# Patient Record
Sex: Female | Born: 1985 | Hispanic: No | Marital: Single | Smoking: Never smoker
Health system: Southern US, Community
[De-identification: ages and names within clinical notes are randomized; demographics above are authoritative.]

## PROBLEM LIST (undated history)

## (undated) DIAGNOSIS — E119 Type 2 diabetes mellitus without complications: Secondary | ICD-10-CM

## (undated) DIAGNOSIS — E282 Polycystic ovarian syndrome: Secondary | ICD-10-CM

## (undated) HISTORY — DX: Polycystic ovarian syndrome: E28.2

## (undated) HISTORY — PX: CHOLECYSTECTOMY: SHX55

---
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
## 2009-12-02 ENCOUNTER — Ambulatory Visit: Payer: Self-pay | Admitting: Family Medicine

## 2009-12-02 DIAGNOSIS — J45909 Unspecified asthma, uncomplicated: Secondary | ICD-10-CM | POA: Insufficient documentation

## 2009-12-02 LAB — CONVERTED CEMR LAB: Rapid Strep: NEGATIVE

## 2009-12-03 ENCOUNTER — Encounter: Payer: Self-pay | Admitting: Family Medicine

## 2010-06-29 ENCOUNTER — Ambulatory Visit: Payer: Self-pay | Admitting: Family Medicine

## 2010-07-01 ENCOUNTER — Telehealth (INDEPENDENT_AMBULATORY_CARE_PROVIDER_SITE_OTHER): Payer: Self-pay | Admitting: *Deleted

## 2010-08-06 ENCOUNTER — Ambulatory Visit: Payer: Self-pay | Admitting: Family Medicine

## 2010-08-06 DIAGNOSIS — J0100 Acute maxillary sinusitis, unspecified: Secondary | ICD-10-CM

## 2010-08-06 DIAGNOSIS — J039 Acute tonsillitis, unspecified: Secondary | ICD-10-CM

## 2010-08-06 DIAGNOSIS — R509 Fever, unspecified: Secondary | ICD-10-CM

## 2010-08-06 DIAGNOSIS — H6690 Otitis media, unspecified, unspecified ear: Secondary | ICD-10-CM | POA: Insufficient documentation

## 2010-08-06 DIAGNOSIS — N920 Excessive and frequent menstruation with regular cycle: Secondary | ICD-10-CM

## 2010-08-07 ENCOUNTER — Telehealth (INDEPENDENT_AMBULATORY_CARE_PROVIDER_SITE_OTHER): Payer: Self-pay | Admitting: *Deleted

## 2010-08-07 ENCOUNTER — Encounter: Payer: Self-pay | Admitting: Family Medicine

## 2010-08-19 ENCOUNTER — Ambulatory Visit: Payer: Self-pay | Admitting: Obstetrics & Gynecology

## 2010-08-19 ENCOUNTER — Encounter: Payer: Self-pay | Admitting: Obstetrics & Gynecology

## 2010-08-19 LAB — CONVERTED CEMR LAB
HCT: 42.1 % (ref 36.0–46.0)
Hemoglobin: 13.4 g/dL (ref 12.0–15.0)
MCV: 86.1 fL (ref 78.0–100.0)
RBC: 4.89 M/uL (ref 3.87–5.11)
RDW: 14.8 % (ref 11.5–15.5)
TSH: 1.629 microintl units/mL (ref 0.350–4.500)
Testosterone: 88.41 ng/dL — ABNORMAL HIGH (ref 10–70)
hCG, Beta Chain, Quant, S: <2 milliintl units/mL

## 2010-09-01 ENCOUNTER — Ambulatory Visit: Payer: Self-pay | Admitting: Obstetrics & Gynecology

## 2010-09-02 ENCOUNTER — Encounter
Admission: RE | Admit: 2010-09-02 | Discharge: 2010-09-02 | Payer: Self-pay | Source: Home / Self Care | Attending: Obstetrics & Gynecology | Admitting: Obstetrics & Gynecology

## 2010-09-10 ENCOUNTER — Encounter
Admission: RE | Admit: 2010-09-10 | Discharge: 2010-09-10 | Payer: Self-pay | Source: Home / Self Care | Attending: Obstetrics & Gynecology | Admitting: Obstetrics & Gynecology

## 2010-09-11 ENCOUNTER — Ambulatory Visit
Admission: RE | Admit: 2010-09-11 | Discharge: 2010-09-11 | Payer: Self-pay | Source: Home / Self Care | Attending: Family Medicine | Admitting: Family Medicine

## 2010-09-11 DIAGNOSIS — E119 Type 2 diabetes mellitus without complications: Secondary | ICD-10-CM | POA: Insufficient documentation

## 2010-09-11 LAB — CONVERTED CEMR LAB
Creatinine,U: 300 mg/dL
Hgb A1c MFr Bld: 6.3 %
Microalbumin U total vol: 30 mg/L

## 2010-09-18 ENCOUNTER — Encounter (INDEPENDENT_AMBULATORY_CARE_PROVIDER_SITE_OTHER): Payer: Self-pay | Admitting: *Deleted

## 2010-09-29 ENCOUNTER — Encounter: Payer: Self-pay | Admitting: Family Medicine

## 2010-10-04 LAB — CONVERTED CEMR LAB
Glucose, Urine, Semiquant: NEGATIVE
Nitrite: NEGATIVE
Urobilinogen, UA: 0.2
pH: 7

## 2010-10-06 NOTE — Letter (Signed)
Summary: Out of Work  [facility]  [address]   [place], [place] [address]   Phone: [phone]  Fax: [phone]    August 06, 2010   Employee:  Audrey Goodwin    To Whom It May Concern:   For Medical reasons, please excuse the above named employee from work today and tomorrow.   If you need additional information, please feel free to contact our office.         Sincerely,    Donna Christen MD

## 2010-10-06 NOTE — Assessment & Plan Note (Signed)
Summary: SORE THROAT (room 3)   Vital Signs:  Patient Profile:   25 Years Old Female CC:      sore throat and RT ear ache X 1 day Height:     64 inches Weight:      230 pounds O2 Sat:      96 % O2 treatment:    Room Air Temp:     99.1 degrees F oral Pulse rate:   102 / minute Resp:     18 per minute BP sitting:   112 / 65  (left arm) Cuff size:   large  Vitals Entered By: Lajean Saver RN (August 06, 2010 10:33 AM)                   Updated Prior Medication List: ADVIL 200 MG TABS (IBUPROFEN)  TYLENOL 325 MG TABS (ACETAMINOPHEN)  PROVENTIL HFA 108 (90 BASE) MCG/ACT AERS (ALBUTEROL SULFATE)   Current Allergies: ! * DUSTHistory of Present Illness Chief Complaint: sore throat and RT ear ache X 1 day History of Present Illness:    Subjective:  Patient complains of fatigue, severe sore throat, right earache, and right facial pain for 2 to 3 days.  She has had sweats and fatigue.  She is able to swallow fluids.  She has minimal cough.  No GI or GU symptoms.  No rash She complains of continuous menstrual period for the past 21 days.  No vaginal discharge or pelvic pain.  She is scheduled to see her GYN on Dec 14.   REVIEW OF SYSTEMS Constitutional Symptoms      Denies fever, chills, night sweats, weight loss, weight gain, and fatigue.  Eyes       Denies change in vision, eye pain, eye discharge, glasses, contact lenses, and eye surgery. Ear/Nose/Throat/Mouth       Complains of ear pain and sore throat.      Denies hearing loss/aids, change in hearing, ear discharge, dizziness, frequent runny nose, frequent nose bleeds, sinus problems, hoarseness, and tooth pain or bleeding.      Comments: rt ear Respiratory       Denies dry cough, productive cough, wheezing, shortness of breath, asthma, bronchitis, and emphysema/COPD.  Cardiovascular       Denies murmurs, chest pain, and tires easily with exhertion.    Gastrointestinal       Complains of stomach pain.      Denies  nausea/vomiting, diarrhea, constipation, blood in bowel movements, and indigestion. Genitourniary       Complains of blood or discharge from vagina.      Denies painful urination, kidney stones, and loss of urinary control.      Comments: period x 21 days OBGYN appt dec 14 Neurological       Denies paralysis, seizures, and fainting/blackouts. Musculoskeletal       Denies muscle pain, joint pain, joint stiffness, decreased range of motion, redness, swelling, muscle weakness, and gout.  Skin       Denies bruising, unusual mles/lumps or sores, and hair/skin or nail changes.  Psych       Denies mood changes, temper/anger issues, anxiety/stress, speech problems, depression, and sleep problems. Other Comments: pt c/o Sore throat and rt ear pain x 1 day. taking OTC tylenol and advil for pain. pt states she has been on her period x 21 days with abd pain. has appt with OBGYN on dec 14.   Past History:  Past Medical History: Asthma  Past Surgical History: Reviewed history  from 12/02/2009 and no changes required. Denies surgical history  Social History: Never Smoked Alcohol use-no Drug use-no Occupation:direct support Single   Objective:  No acute distress, alert and oriented  Eyes:  Pupils are equal, round, and reactive to light and accomdation.  Extraocular movement is intact.  Conjunctivae are not inflamed.  Ears:  Canals normal.  Left tympanic membrane normal.  Right tympanic membrane erythematous Nose:  Turbinates are markedly congested.  Maxillary and frontal sinus tenderness present.   Pharynx:  Erythematous and slightly swollen without obstruction, worse on right.  Minimal exudate.  Neck:  Supple.  Tender shotty anterior nodes are palpated bilaterally.  Lungs:  Clear to auscultation.  Breath sounds are equal.  Heart:  Regular rate and rhythm without murmurs, rubs, or gallops.  Abdomen:  Mild tenderness over spleen without masses or hepatosplenomegaly.  Bowel sounds are present.   No CVA or flank tenderness.  Skin:  No rash  Assessment New Problems: ACUTE MAXILLARY SINUSITIS (ICD-461.0) MENORRHAGIA (ICD-626.2) FEVER UNSPECIFIED (ICD-780.60) OTITIS MEDIA, ACUTE, RIGHT (ICD-382.9) ACUTE TONSILLITIS (ICD-463)   Patient Education:    Plan New Medications/Changes: LORTAB 5 5-500 MG TABS (HYDROCODONE-ACETAMINOPHEN) One or two tabs by mouth hs as needed pain  #10 (ten) x 0, 08/06/2010, Donna Christen MD PREDNISONE 10 MG TABS (PREDNISONE) 2 PO BID for 2 days, then 1 BID for 2 days, then 1 daily for 2 days.  Take PC  #14 x 0, 08/06/2010, Donna Christen MD CEFDINIR 300 MG CAPS (CEFDINIR) 1 by mouth q12hr  #20 x 0, 08/06/2010, Donna Christen MD  New Orders: CBC [[id number]] Urinalysis [CPT-81003] Monospot [13086] Urine Pregnancy  [CPT-81025] Rapid Strep [57846] Rocephin  250mg  [J0696] Solumedrol up to 125mg  [J2930] T-Culture, Throat [96295-28413] Admin of Therapeutic Inj  intramuscular or subcutaneous [96372] Est. Patient Level IV [24401] Planning Comments:   Rapid strep test negative; Monospot negative; Urine pregnancy test negative; CBC:  WBC 13.8 with 76.1 GR; Hgb 13.4 Rocephin 1gm IM; SoluMedrol 125mg  IM; urinalysis unremarkable.  Throat culture pending Begin Omnicef and tapering course of prednisone.  Analgesic at bedtime.  Rest.  Increase fluid intake. Follow-up with GYN as scheduled. Return for worsening symptoms   The patient and/or caregiver has been counseled thoroughly with regard to medications prescribed including dosage, schedule, interactions, rationale for use, and possible side effects and they verbalize understanding.  Diagnoses and expected course of recovery discussed and will return if not improved as expected or if the condition worsens. Patient and/or caregiver verbalized understanding.  Prescriptions: LORTAB 5 5-500 MG TABS (HYDROCODONE-ACETAMINOPHEN) One or two tabs by mouth hs as needed pain  #10 (ten) x 0   Entered and Authorized by:   Donna Christen MD   Signed by:   Donna Christen MD on 08/06/2010   Method used:   Print then Give to Patient   RxID:   [id number] PREDNISONE 10 MG TABS (PREDNISONE) 2 PO BID for 2 days, then 1 BID for 2 days, then 1 daily for 2 days.  Take PC  #14 x 0   Entered and Authorized by:   Donna Christen MD   Signed by:   Donna Christen MD on 08/06/2010   Method used:   Print then Give to Patient   RxID:   [id number] CEFDINIR 300 MG CAPS (CEFDINIR) 1 by mouth q12hr  #20 x 0   Entered and Authorized by:   Donna Christen MD   Signed by:   Donna Christen MD on 08/06/2010   Method used:   Print then Give to Patient   RxID:   [id number]   Medication Administration  Injection # 1:    Medication: Rocephin  250mg     Diagnosis: FEVER UNSPECIFIED  (ICD-780.60)    Route: IM    Site: RUOQ gluteus    Exp Date: 01/04/2013    Lot #: [id number]    Mfr: Sandoz    Comments: 1 gram given    Patient tolerated injection without complications    Given by: Lajean Saver RN (August 06, 2010 12:47 PM)  Injection # 2:    Medication: Solumedrol up to 125mg     Diagnosis: FEVER UNSPECIFIED (ICD-780.60)    Route: IM    Site: LUOQ gluteus    Exp Date: 02/04/2013    Lot #: [id number]    Mfr: Pharmacia    Patient tolerated injection without complications    Given by: Lajean Saver RN (August 06, 2010 12:48 PM)  Orders Added:  1)  CBC [[address]-10000] 2)  Urinalysis [CPT-81003] 3)  Monospot [34742] 4)  Urine Pregnancy [CPT-81025] 5)  Rapid Strep [59563] 6)  Rocephin  250mg  [J0696] 7)  Solumedrol up to 125mg  [J2930] 8)  T-Culture, Throat [87564-33295] 9)  Admin of Therapeutic Inj  intramuscular or subcutaneous [96372] 10)  Est. Patient Level IV [18841]    Laboratory Results   Urine Tests  Date/Time Received: August 06, 2010 11:31 AM  Date/Time Reported: August 06, 2010 11:31 AM   Routine Urinalysis   Color: yellow Appearance: Hazy Glucose: negative   (Normal Range: Negative) Bilirubin: negative   (Normal Range: Negative) Ketone: negative   (Normal Range: Negative) Spec. Gravity: 1.020   (Normal Range: 1.003-1.035) Blood: trace-intact   (Normal Range: Negative) pH: 7.0   (Normal Range: 5.0-8.0) Protein: negative   (Normal Range: Negative) Urobilinogen: 0.2   (Normal Range: 0-1) Nitrite: negative   (Normal Range: Negative) Leukocyte Esterace: negative   (Normal Range: Negative)    Urine HCG: negative  Blood Tests   Date/Time Received: August 06, 2010 11:30 AM  Date/Time Reported: August 06, 2010 11:30 AM    Mono: negative Date/Time Received: August 06, 2010 10:57 AM  Date/Time Reported: August 06, 2010 10:58 AM   Other Tests  Rapid Strep: negative  Kit Test Internal QC: Negative   (Normal Range:  Negative)

## 2010-10-06 NOTE — Letter (Signed)
Summary: Out of Work  [facility]  [address]   [place], [place] [address]   Phone: [phone]  Fax: [phone]    June 29, 2010   Employee:  WILLETTE MUDRY    To Whom It May Concern:   For Medical reasons, please excuse the above named employee from work today.    If you need additional information, please feel free to contact our office.         Sincerely,    Donna Christen MD

## 2010-10-06 NOTE — Progress Notes (Signed)
  Phone Note Outgoing Call Call back at [facility] Phone [phone]   Call placed by: Emilio Math,  August 07, 2010 12:14 PM Call placed to: Patient Summary of Call: Left msg

## 2010-10-06 NOTE — Progress Notes (Signed)
  Phone Note Outgoing Call Call back at [facility] Phone [phone]   Call placed by: Lajean Saver RN,  July 01, 2010 12:45 PM Call placed to: Patient Summary of Call: Patient is still coughing but is improving. Encouraged to continue with Rxs

## 2010-10-06 NOTE — Letter (Signed)
Summary: Out of Work  [facility]  [address]   [place], [place] [address]   Phone: [phone]  Fax: [phone]    December 02, 2009   Employee:  LADAVIA LINDENBAUM    To Whom It May Concern:   For Medical reasons, please excuse the above named employee from work today and tomorrow.  If you need additional information, please feel free to contact our office.         Sincerely,    Donna Christen MD

## 2010-10-06 NOTE — Assessment & Plan Note (Signed)
Summary: POSSIBLE FLU   Vital Signs:  Patient Profile:   25 Years Old Female CC:      HA, productive cough, chills, N/V, diarreha, X 10 days Height:     64 inches Weight:      233 pounds O2 Sat:      97 % O2 treatment:    Room Air Temp:     97.0 degrees F oral Pulse rate:   93 / minute Resp:     20 per minute BP sitting:   127 / 72  (right arm) Cuff size:   large  Pt. in pain?   yes    Location:   head    Intensity:   8    Type:       aching  Vitals Entered By: Lajean Saver RN (December 02, 2009 8:58 AM)                   Updated Prior Medication List: Arloa Koh D 60-600 MG XR12H-TAB (PSEUDOEPHEDRINE-GUAIFENESIN)   Current Allergies: ! * DUSTHistory of Present Illness Chief Complaint: HA, productive cough, chills, N/V, diarreha, X 10 days History of Present Illness: Subjective: Patient complains of persistent allergy symptoms for about 10 days (she has history of seasonal allergies).  About 4 days ago she developed increased congestion with mild sore throat.  She has a history of asthma and has had increased chest congestion + cough worse at night. No pleuritic pain No wheezing + post-nasal drainage No sinus pain/pressure No itchy/red eyes No earache No hemoptysis No SOB but has tightness in anterior chest. Had fever/chills about 4 days ago resolved No nausea No vomiting No abdominal pain Had diarrhea briefly several days ago resolved No skin rashes + fatigue No myalgias No headache Used OTC meds without relief   REVIEW OF SYSTEMS Constitutional Symptoms       Complains of chills and fatigue.     Denies fever, night sweats, weight loss, and weight gain.  Eyes       Denies change in vision, eye pain, eye discharge, glasses, contact lenses, and eye surgery. Ear/Nose/Throat/Mouth       Denies hearing loss/aids, change in hearing, ear pain, ear discharge, dizziness, frequent runny nose, frequent nose bleeds, sinus problems, sore throat, hoarseness, and tooth  pain or bleeding.  Respiratory       Complains of productive cough and shortness of breath.      Denies dry cough, wheezing, asthma, bronchitis, and emphysema/COPD.  Cardiovascular       Denies murmurs, chest pain, and tires easily with exhertion.    Gastrointestinal       Complains of nausea/vomiting and diarrhea.      Denies stomach pain, constipation, blood in bowel movements, and indigestion. Genitourniary       Denies painful urination, kidney stones, and loss of urinary control. Neurological       Complains of headaches.      Denies paralysis, seizures, and fainting/blackouts. Musculoskeletal       Denies muscle pain, joint pain, joint stiffness, decreased range of motion, redness, swelling, muscle weakness, and gout.  Skin       Denies bruising, unusual mles/lumps or sores, and hair/skin or nail changes.  Psych       Denies mood changes, temper/anger issues, anxiety/stress, speech problems, depression, and sleep problems. Other Comments: patient was previously takign anantibiotic for a tooth infection. symptoms X 10 days   Past History:  Past Medical History: Asthma recent tooth infection  Past  Surgical History: Denies surgical history  Family History: Leukemia- Mother  Social History: Never Smoked Alcohol use-no Drug use-no Smoking Status:  never Drug Use:  no   Objective:  No acute distress  Eyes:  Pupils are equal, round, and reactive to light and accomdation.  Extraocular movement is intact.  Conjunctivae are not inflamed.  Ears:  Canals normal.   Left tympanic membrane normal.  Right tympanic membrane has serous effusion Nose:  Congested turbinates worse on right.  No sinus tenderness Pharynx:  Erythematous Neck:  Supple.  Slightly tender shotty anterior/posterior nodes are palpated bilaterally.  Lungs:   Faint scattered wheezes.   Breath sounds are equal.    Heart:  Regular rate and rhythm without murmurs, rubs, or gallops.  Abdomen:  Nontender without  masses or hepatosplenomegaly.  Bowel sounds are present.  No CVA or flank tenderness.  Rapid strep test negative  Assessment New Problems: OTITIS MEDIA, SEROUS, ACUTE, RIGHT (ICD-381.01) URI (ICD-465.9) ASTHMA (ICD-493.90)   Plan New Medications/Changes: PROAIR HFA 108 (90 BASE) MCG/ACT AERS (ALBUTEROL SULFATE) Two inhalations q4-6hr as needed.  Max 12 puffs/day  #1 MDI x 0, 12/02/2009, Donna Christen MD PREDNISONE 10 MG TABS (PREDNISONE) 2 PO BID for 2 days, then 1 BID for 2 days, then 1 daily for 2 days.  Take PC  #14 x 0, 12/02/2009, Donna Christen MD BENZONATATE 200 MG CAPS (BENZONATATE) One by mouth hs as needed cough  #12 x 0, 12/02/2009, Donna Christen MD AZITHROMYCIN 250 MG TABS (AZITHROMYCIN) Two tabs by mouth on day 1, then 1 tab daily on days 2 through 5  #6 tabs x 0, 12/02/2009, Donna Christen MD  New Orders: New Patient Level III [99203] Rapid Strep [16606] Planning Comments:   Begin Z-pack, expectorant/decongestant, cough suppressant at bedtime, tapering course of prednisone, albuterol inhaler. Follow-up with PCP if not improving one week.   The patient and/or caregiver has been counseled thoroughly with regard to medications prescribed including dosage, schedule, interactions, rationale for use, and possible side effects and they verbalize understanding.  Diagnoses and expected course of recovery discussed and will return if not improved as expected or if the condition worsens. Patient and/or caregiver verbalized understanding.  Prescriptions: PROAIR HFA 108 (90 BASE) MCG/ACT AERS (ALBUTEROL SULFATE) Two inhalations q4-6hr as needed.  Max 12 puffs/day  #1 MDI x 0   Entered and Authorized by:   Donna Christen MD   Signed by:   Donna Christen MD on 12/02/2009   Method used:   Print then Give to Patient   RxID:   [id number] PREDNISONE 10 MG TABS (PREDNISONE) 2 PO BID for 2 days, then 1 BID for 2 days, then 1 daily for 2 days.  Take PC  #14 x 0   Entered and Authorized by:    Donna Christen MD   Signed by:   Donna Christen MD on 12/02/2009   Method used:   Print then Give to Patient   RxID:   [id number] BENZONATATE 200 MG CAPS (BENZONATATE) One by mouth hs as needed cough  #12 x 0   Entered and Authorized by:   Donna Christen MD   Signed by:   Donna Christen MD on 12/02/2009   Method used:   Print then Give to Patient   RxID:   [id number] AZITHROMYCIN 250 MG TABS (AZITHROMYCIN) Two tabs by mouth on day 1, then 1 tab daily on days 2 through 5  #6 tabs x 0   Entered and Authorized by:   Donna Christen  MD   Signed by:   Donna Christen MD on 12/02/2009   Method used:   Print then Give to Patient   RxID:   [id number]   Patient Instructions: 1)  May use Mucinex D (guaifenesin with decongestant) twice daily for congestion. 2)  Increase fluid intake, rest. 3)  May use Afrin nasal spray (or generic oxymetazoline) twice daily for about 5 days.  Also recommend using saline nasal spray several times daily and/or saline nasal irrigation. 4)  Followup with family doctor if not improving one week.   Laboratory Results  Date/Time Received: December 02, 2009 9:55 AM  Date/Time Reported: December 02, 2009 9:55 AM   Other Tests  Rapid Strep: negative  Kit Test Internal QC: Negative   (Normal Range: Negative)

## 2010-10-06 NOTE — Letter (Signed)
Summary: Handout Printed  Printed Handout:  - Rheumatic Fever 

## 2010-10-06 NOTE — Assessment & Plan Note (Signed)
Summary: Cough-greenish yellow, L ear pain, wheezing x 2 wks rm 3   Vital Signs:  Patient Profile:   25 Years Old Female CC:      Cold & URI symptoms Height:     64 inches Weight:      225 pounds O2 Sat:      98 % O2 treatment:    Room Air Temp:     98.3 degrees F oral Pulse rate:   87 / minute Pulse rhythm:   regular Resp:     16 per minute BP sitting:   106 / 73  (left arm) Cuff size:   regular  Vitals Entered By: Areta Haber CMA (June 29, 2010 4:45 PM)                  Current Allergies: ! * DUSTHistory of Present Illness Chief Complaint: Cold & URI symptoms History of Present Illness:  Subjective: Patient complains of cough for 2 weeks.  Symptoms started as a cold, now worse past 2 days. + sore throat Cough now worse at night. No pleuritic pain Occasional wheezing + nasal congestion + post-nasal drainage ? sinus pain/pressure No itchy/red eyes + left earache for one week No hemoptysis No SOB No fever/chills No nausea No vomiting No abdominal pain No diarrhea No skin rashes + fatigue No myalgias No headache Used OTC meds without relief   Current Problems: OTITIS MEDIA, LEFT (ICD-382.9) ACUTE BRONCHITIS (ICD-466.0) OTITIS MEDIA, SEROUS, ACUTE, RIGHT (ICD-381.01) URI (ICD-465.9) ASTHMA (ICD-493.90)   Current Meds MUCINEX D 60-600 MG XR12H-TAB (PSEUDOEPHEDRINE-GUAIFENESIN)  AZITHROMYCIN 250 MG TABS (AZITHROMYCIN) Two tabs by mouth on day 1, then 1 tab daily on days 2 through 5 BENZONATATE 200 MG CAPS (BENZONATATE) One by mouth hs as needed cough PREDNISONE 10 MG TABS (PREDNISONE) 2 PO BID for 2 days, then 1 BID for 2 days, then 1 daily for 2 days.  Take PC  REVIEW OF SYSTEMS Constitutional Symptoms      Denies fever, chills, night sweats, weight loss, weight gain, and fatigue.  Eyes       Denies change in vision, eye pain, eye discharge, glasses, contact lenses, and eye surgery. Ear/Nose/Throat/Mouth       Complains of ear pain and  sore throat.      Denies hearing loss/aids, change in hearing, ear discharge, dizziness, frequent runny nose, frequent nose bleeds, sinus problems, hoarseness, and tooth pain or bleeding.      Comments: L x 2wks Respiratory       Complains of productive cough, wheezing, and shortness of breath.      Denies dry cough, asthma, bronchitis, and emphysema/COPD.      Comments: Chest congestion Cardiovascular       Denies murmurs, chest pain, and tires easily with exhertion.    Gastrointestinal       Denies stomach pain, nausea/vomiting, diarrhea, constipation, blood in bowel movements, and indigestion. Genitourniary       Denies painful urination, kidney stones, and loss of urinary control. Neurological       Denies paralysis, seizures, and fainting/blackouts. Musculoskeletal       Denies muscle pain, joint pain, joint stiffness, decreased range of motion, redness, swelling, muscle weakness, and gout.  Skin       Denies bruising, unusual mles/lumps or sores, and hair/skin or nail changes.  Psych       Denies mood changes, temper/anger issues, anxiety/stress, speech problems, depression, and sleep problems. Other Comments: Greenish-yellow x 2 wks. Pt has not seen  a PCP for this.   Past History:  Past Medical History: Last updated: 12/02/2009 Asthma recent tooth infection  Past Surgical History: Last updated: 12/02/2009 Denies surgical history  Family History: Last updated: 12/02/2009 Leukemia- Mother  Social History: Last updated: 12/02/2009 Never Smoked Alcohol use-no Drug use-no  Risk Factors: Smoking Status: never (12/02/2009)   Objective:  No acute distress  Eyes:  Pupils are equal, round, and reactive to light and accomdation.  Extraocular movement is intact.  Conjunctivae are not inflamed.  Ears:  Canals normal.   Left tympanic membrane red; right tympanic membrane normal Nose:  Normal septum.  Normal turbinates, mildly congested.  Mild maxillary sinus tenderness  present.  Pharynx:  Erythematous Neck:  Supple.  No adenopathy is present.  No thyromegaly is present  Lungs:   Scattered wheezes.  Breath sounds are equal.  Heart:  Regular rate and rhythm without murmurs, rubs, or gallops.  Abdomen:  Nontender without masses or hepatosplenomegaly.  Bowel sounds are present.  No CVA or flank tenderness.  Assessment New Problems: OTITIS MEDIA, LEFT (ICD-382.9) ACUTE BRONCHITIS (ICD-466.0)  SUSPECT SINUSITIS ALSO  Plan New Medications/Changes: PREDNISONE 10 MG TABS (PREDNISONE) 2 PO BID for 2 days, then 1 BID for 2 days, then 1 daily for 2 days.  Take PC  #14 x 0, 06/29/2010, Donna Christen MD BENZONATATE 200 MG CAPS (BENZONATATE) One by mouth hs as needed cough  #12 x 0, 06/29/2010, Donna Christen MD AZITHROMYCIN 250 MG TABS (AZITHROMYCIN) Two tabs by mouth on day 1, then 1 tab daily on days 2 through 5  #6 tabs x 0, 06/29/2010, Donna Christen MD  New Orders: Est. Patient Level III [phone] Planning Comments:   Z-pack, expectorant/decongestant, cough suppressant at bedtime, tapering course of prednisone. Follow-up with PCP if not improving. Recommend flu immunization when well   The patient and/or caregiver has been counseled thoroughly with regard to medications prescribed including dosage, schedule, interactions, rationale for use, and possible side effects and they verbalize understanding.  Diagnoses and expected course of recovery discussed and will return if not improved as expected or if the condition worsens. Patient and/or caregiver verbalized understanding.  Prescriptions: PREDNISONE 10 MG TABS (PREDNISONE) 2 PO BID for 2 days, then 1 BID for 2 days, then 1 daily for 2 days.  Take PC  #14 x 0   Entered and Authorized by:   Donna Christen MD   Signed by:   Donna Christen MD on 06/29/2010   Method used:   Print then Give to Patient   RxID:   [id number] BENZONATATE 200 MG CAPS (BENZONATATE) One by mouth hs as needed cough  #12 x 0   Entered  and Authorized by:   Donna Christen MD   Signed by:   Donna Christen MD on 06/29/2010   Method used:   Print then Give to Patient   RxID:   [id number] AZITHROMYCIN 250 MG TABS (AZITHROMYCIN) Two tabs by mouth on day 1, then 1 tab daily on days 2 through 5  #6 tabs x 0   Entered and Authorized by:   Donna Christen MD   Signed by:   Donna Christen MD on 06/29/2010   Method used:   Print then Give to Patient   RxID:   [id number]   Patient Instructions: 1)  May use Mucinex D (guaifenesin with decongestant) twice daily for congestion. 2)  Increase fluid intake, rest. 3)  May use Afrin nasal spray (or generic oxymetazoline) twice daily for about 5 days.  Also recommend using saline nasal spray several times daily and/or saline nasal irrigation. 4)  Followup with family doctor if not improving one week.   Orders Added: 1)  Est. Patient Level III [13086]

## 2010-10-08 NOTE — Assessment & Plan Note (Signed)
Summary: NOV: DM   Vital Signs:  Patient profile:   25 year old female Height:      64 inches Weight:      226 pounds BMI:     38.93 Pulse rate:   78 / minute BP sitting:   90 / 51  (right arm) Cuff size:   large  Vitals Entered By: Avon Gully CMA, Duncan Dull) (September 11, 2010 2:11 PM) CC: NP-referred by GYN check for DM   CC:  NP-referred by GYN check for DM.  History of Present Illness: she was referred here after workup for PCOS..  It was discovered that she was diabetic.  She had an A1c of 6.5 in early December.  She is not currently trying to get pregnant.  She is overweight and admit sheet high carb diet.  She does come from Hispanic family and says that this is somewhat of a barrier to eating a more healthy diet.  She is not getting any regular exercise.  Then she says her job is very physical.  she denies any symptoms of increased thirst urination or shakiness.  She denies any family history of diabetes.  She says she feels her mother will be supportive of her and trying to make some changes.b.i.d. start her on metformin 850 mg daily.  She says she is having lots of diarrhea with this.  Habits & Providers  Alcohol-Tobacco-Diet     Alcohol drinks/day: 0     Tobacco Status: never  Exercise-Depression-Behavior     Does Patient Exercise: no     STD Risk: never     Drug Use: never     Seat Belt Use: always  Current Medications (verified): 1)  Metformin Hcl 850 Mg Tabs (Metformin Hcl) .... Take One Tablet By Mouth Once  A Day  Allergies (verified): 1)  ! * Dust  Comments:  Nurse/Medical Assistant: The patient's medications and allergies were reviewed with the patient and were updated in the Medication and Allergy Lists. 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  Never Smoked Alcohol use-no Drug use-no Occupation:direct support Single Daily caffeine. Does Patient  Exercise:  no STD Risk:  never Drug Use:  never Seat Belt Use:  always  Review of Systems       No fever/sweats/weakness, unexplained weight loss/gain.  No vison changes.  No difficulty hearing/ringing in ears, hay fever/allergies.  No chest pain/discomfort, palpitations.  No Br lump/nipple discharge.  No cough/wheeze.  No blood in BM, nausea/vomiting/diarrhea.  No nighttime urination, leaking urine, unusual vaginal bleeding, discharge (penis or vagina).  No muscle/joint pain. No rash, change in mole.  No HA, memory loss.  No anxiety, sleep d/o, depression.  No easy bruising/bleeding, unexplained lump   Physical Exam  General:  Well-developed,well-nourished,in no acute distress; alert,appropriate and cooperative throughout examination. Obese.  Head:  Normocephalic and atraumatic without obvious abnormalities. No apparent alopecia or balding. Lungs:  Normal respiratory effort, chest expands symmetrically. Lungs are clear to auscultation, no crackles or wheezes. Heart:  Normal rate and regular rhythm. S1 and S2 normal without gallop, murmur, click, rub or other extra sounds. Skin:  no rashes.   Psych:  Cognition and judgment appear intact. Alert and cooperative with normal attention span and concentration. No apparent delusions, illusions, hallucinations   Impression & Recommendations:  Problem # 1:  DIABETES MELLITUS (ICD-250.00) we discussed her new diagnosis.  Will refer  her for diabetic nutrition counseling. Discussed diet and exercise.  Will dec her metformin to stop her milligrams to reduce her diarrhea.  Her A1c today is that she dropped a little bit from last time.  I explained to her that with weight loss diet and exercise I think she might be a come off of the metformin completely and monitor her sugars and control them with diet and exercise. to the current flu vaccine.  We will discuss a pneumonia vaccine make sure her tetanus is updated at the follow-up visit.  We can also discuss  yearly eye exams at the follow-up visit. F/U in 3months.  her microalbumin is negative today. Her updated medication list for this problem includes:    Metformin Hcl 500 Mg Tabs (Metformin hcl) .Marland Kitchen... Take 1 tablet by mouth once a day  Orders: Fingerstick (16109) Hemoglobin A1C (60454) Nutrition Referral (Nutrition) Urine Microalbumin (09811)  Complete Medication List: 1)  Metformin Hcl 500 Mg Tabs (Metformin hcl) .... Take 1 tablet by mouth once a day  Contraindications/Deferment of Procedures/Staging:    Test/Procedure: FLU VAX    Reason for deferment: patient declined   Patient Instructions: 1)  Encourage regular exercise 2)  Start eating breakfast in the morning.  3)  Please schedule a follow-up appointment in 3 months .  4)  We will call you with the Nutrition referral.  Prescriptions: METFORMIN HCL 500 MG TABS (METFORMIN HCL) Take 1 tablet by mouth once a day  #30 x 2   Entered and Authorized by:   Nani Gasser MD   Signed by:   Nani Gasser MD on 09/11/2010   Method used:   Electronically to        [facility]* (retail)       [address] [place], [place]  [address]       Ph: [phone]       Fax: [phone]   RxID:   [id number]    Orders Added: 1)  Fingerstick [36416] 2)  Hemoglobin A1C [83036] 3)  Nutrition Referral [Nutrition] 4)  Urine Microalbumin [82044] 5)  New Patient Level III [99203]    Laboratory Results   Urine Tests  Date/Time Received: 09/11/09 Date/Time Reported: 09/11/09  Microalbumin (urine): 30 mg/L Creatinine: 300mg /dL  A:C Ratio 30mg /g  Blood Tests   Date/Time Received: 09/11/09 Date/Time Reported: 09/11/09  HGBA1C: 6.3%   (Normal Range: Non-Diabetic - 3-6%   Control Diabetic - 6-8%)

## 2010-10-08 NOTE — Letter (Signed)
Summary: Primary Care Consult Scheduled Letter  [facility]  [address]   [place], [place] [address]   Phone: [phone]  Fax: [phone]      09/18/2010 MRN: [id number]  SHYLIE POLO [address] [place], [place]  [address]    Dear Ms. Formisano,    We have scheduled an appointment for you.  At the recommendation of Dr.Metheney, we have scheduled you a consult with [facility] on 09/29/10 at 10:00.  Their address is [address] [place], [place]. [address] (Go to [employer]). The scheduling phone number is [phone].  If this appointment day and time is not convenient for you, please feel free to call the office of the doctor you are being referred to at the number listed above and reschedule the appointment.     It is important for you to keep your scheduled appointments. We are here to make sure you are given good patient care.     Thank you, Michaelle Copas  Patient Care Coordinator [facility] Kathryne Sharper

## 2010-10-21 ENCOUNTER — Encounter: Payer: Self-pay | Admitting: Family Medicine

## 2010-10-21 ENCOUNTER — Ambulatory Visit (INDEPENDENT_AMBULATORY_CARE_PROVIDER_SITE_OTHER): Payer: BC Managed Care – PPO

## 2010-10-21 DIAGNOSIS — Z111 Encounter for screening for respiratory tuberculosis: Secondary | ICD-10-CM

## 2010-10-24 ENCOUNTER — Ambulatory Visit: Payer: BC Managed Care – PPO | Admitting: Family Medicine

## 2010-10-24 ENCOUNTER — Encounter (INDEPENDENT_AMBULATORY_CARE_PROVIDER_SITE_OTHER): Payer: Self-pay | Admitting: *Deleted

## 2010-10-27 ENCOUNTER — Encounter: Payer: Self-pay | Admitting: Emergency Medicine

## 2010-10-27 ENCOUNTER — Telehealth: Payer: Self-pay | Admitting: Family Medicine

## 2010-10-27 ENCOUNTER — Other Ambulatory Visit: Payer: Self-pay | Admitting: Family Medicine

## 2010-10-27 ENCOUNTER — Ambulatory Visit
Admission: RE | Admit: 2010-10-27 | Discharge: 2010-10-27 | Disposition: A | Discharge status: Home / Self Care | Payer: BC Managed Care – PPO | Source: Ambulatory Visit | Attending: Family Medicine | Admitting: Family Medicine

## 2010-10-27 DIAGNOSIS — R7611 Nonspecific reaction to tuberculin skin test without active tuberculosis: Secondary | ICD-10-CM | POA: Insufficient documentation

## 2010-10-28 NOTE — Assessment & Plan Note (Addendum)
Summary: TB check/TM  Nurse Visit   Preventive Screening-Counseling & Management  Comments: Read PPD on 10/24/10: 15mm. Advised pt that her PPD was positive, per Dr Cathren Harsh she needs to call Dr Linford Arnold on Monday for further treatment. pt agrees. 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 * Dust  PPD Results    Date of reading: 10/24/2010    Results: > 15mm    Interpretation: positive    Appended Document: TB check/TM Patient is assymptomatic at present.  She notes that past PPD's have been negative.  Appended Document: TB check/TM Call pt: Needs cxr to rule out active TB. We can order adn she can have this done downstairs.  February 20, 201212:49 PM Metheney MD, Santina Evans   [facility] informing Pt of the above and asked Pt to CB.Arvilla Market CMA, Michelle October 26, 2010 1:01 PM

## 2010-10-28 NOTE — Letter (Signed)
Summary: [facility]   [facility]   Imported By: Kassie Mends 10/22/2010 08:02:19  _____________________________________________________________________  External Attachment:    Type:   Image     Comment:   External Document

## 2010-10-28 NOTE — Assessment & Plan Note (Signed)
Summary: TB  Nurse Visit   Allergies: 1)  ! * Dust  Immunizations Administered:  PPD Skin Test:    Vaccine Type: PPD    Site: left forearm    Mfr: Sanofi Pasteur    Dose: 0.1 ml    Route: ID    Given by: Sue Lush McCrimmon CMA, (AAMA)    Exp. Date: 05/07/2012    Lot #: [id number]  Orders Added: 1)  TB Skin Test [86580] 2)  Admin 1st Vaccine [phone]

## 2010-10-30 ENCOUNTER — Encounter: Payer: Self-pay | Admitting: Family Medicine

## 2010-11-03 NOTE — Letter (Signed)
Summary: RECORD OF TUBERCULOSIS SCREENING  RECORD OF TUBERCULOSIS SCREENING   Imported By: Dannette Barbara 10/27/2010 11:09:17  _____________________________________________________________________  External Attachment:    Type:   Image     Comment:   External Document

## 2010-11-03 NOTE — Miscellaneous (Signed)
Summary: RECORD OF TB SCREENING  RECORD OF TB SCREENING   Imported By: Dannette Barbara 10/30/2010 09:00:25  _____________________________________________________________________  External Attachment:    Type:   Image     Comment:   External Document

## 2010-11-03 NOTE — Progress Notes (Signed)
     New Problems: NONSPEC REACT TUBERCULIN SKIN TEST W/O ACTIVE TB (ICD-795.51)   New Problems: NONSPEC REACT TUBERCULIN SKIN TEST W/O ACTIVE TB (ICD-795.51)

## 2010-11-04 ENCOUNTER — Ambulatory Visit: Payer: Self-pay | Admitting: Obstetrics & Gynecology

## 2010-11-10 ENCOUNTER — Encounter: Payer: Self-pay | Admitting: Family Medicine

## 2010-11-12 NOTE — Miscellaneous (Signed)
Summary: TB/[place]  TB/[place]   Imported By: Lanelle Bal 11/06/2010 09:55:20  _____________________________________________________________________  External Attachment:    Type:   Image     Comment:   External Document

## 2010-12-14 ENCOUNTER — Ambulatory Visit: Payer: Self-pay | Admitting: Family Medicine

## 2010-12-31 ENCOUNTER — Encounter: Payer: Self-pay | Admitting: Family Medicine

## 2011-01-03 ENCOUNTER — Encounter: Payer: Self-pay | Admitting: Family Medicine

## 2011-01-04 ENCOUNTER — Ambulatory Visit (INDEPENDENT_AMBULATORY_CARE_PROVIDER_SITE_OTHER): Payer: BC Managed Care – PPO | Admitting: Family Medicine

## 2011-01-04 ENCOUNTER — Encounter: Payer: Self-pay | Admitting: Family Medicine

## 2011-01-04 DIAGNOSIS — E119 Type 2 diabetes mellitus without complications: Secondary | ICD-10-CM

## 2011-01-04 DIAGNOSIS — H9209 Otalgia, unspecified ear: Secondary | ICD-10-CM

## 2011-01-04 DIAGNOSIS — E282 Polycystic ovarian syndrome: Secondary | ICD-10-CM

## 2011-01-04 LAB — POCT GLYCOSYLATED HEMOGLOBIN (HGB A1C): Hemoglobin A1C: 6

## 2011-01-04 MED ORDER — FEXOFENADINE HCL 180 MG PO TABS: 180.0000 mg | ORAL_TABLET | Freq: Every day | ORAL | Status: AC

## 2011-01-04 MED ORDER — METFORMIN HCL 500 MG PO TABS: 500.0000 mg | ORAL_TABLET | Freq: Every day | ORAL | Status: DC

## 2011-01-04 NOTE — Assessment & Plan Note (Signed)
Continue metformin to help prevent DM.

## 2011-01-04 NOTE — Assessment & Plan Note (Signed)
Well controlled looks great. Microalbumin is negative. F/u in 6 months. Wilsl conintue the metformin mostly for her PCOS.

## 2011-01-04 NOTE — Progress Notes (Signed)
  Subjective:    Patient ID: Audrey Goodwin, female    DOB: 1986/04/16, 25 y.o.   MRN: [id number]  HPI  Right ear pain - for a few days.  No drianage or fever. Wants to make sure not infected. Feels clogged.  No sinus sxs but does have allegies.  She is not taking anything for allergies.   F/U DM/PCOS - She is doing really well. A1C is down to 6.0. She is not getting regular xercise.  Due for microalbumin today.  She is trying to eat healthy.    Review of Systems     Objective:   Physical Exam  Constitutional: She appears well-developed and well-nourished.  HENT:  Head: Normocephalic and atraumatic.  Right Ear: External ear normal.  Left Ear: External ear normal.  Nose: Nose normal.  Mouth/Throat: Oropharynx is clear and moist.  Eyes: Pupils are equal, round, and reactive to light.  Neck: No thyromegaly present.  Cardiovascular: Normal rate, regular rhythm and normal heart sounds.   Pulmonary/Chest: Effort normal and breath sounds normal.  Lymphadenopathy:    She has no cervical adenopathy.  Skin: Skin is warm and dry.  Psychiatric: She has a normal mood and affect. Her behavior is normal.          Assessment & Plan:  Right ear Pain- Recommend Take an antihistamine for acute relief and see if better.  If not and pain persists then follow up for recheck in about 5 day.

## 2011-01-19 NOTE — Assessment & Plan Note (Signed)
Audrey Goodwin, Audrey Goodwin               ACCOUNT NO.:  [id number]   MEDICAL RECORD NO.:  [id number]          PATIENT TYPE:  POB   LOCATION:  [facility] [place]         FACILITY:  [facility]   PHYSICIAN:  Jaynie Collins, MD     DATE OF BIRTH:  12-Sep-1985   DATE OF SERVICE:  08/19/2010                                  CLINIC NOTE   REASON FOR VISIT:  Irregular bleeding.   HISTORY OF PRESENT ILLNESS:  The patient is a 25 year old, gravida 0,  who presents today with a report of irregular bleeding.  The patient had  menarche at age 51 or 74 and reports having irregular periods since then  but over the last 2 months she has had an episode where she had bleeding  for 25 days, stopped for few days, and had another episode like a  menstrual period.  The patient is very concerned about this.  The  patient is obese, does have some acne, but denies having excess body  hair or other evidence of hirsutism.  The patient is not sexually active  and has not had any sexual activity for the last 3 years and says she is  not pregnant.  She denies any abnormal vaginal discharge or any other  concerns.   PAST OB/GYN HISTORY:  Gravida 0.   MENSTRUAL HISTORY:  As above.  The patient reports having an abnormal  Pap smear in 2007 and has been lost to follow up since then apart from  human papillomavirus infection, she denies any sexually transmitted  infections or any gynecologic conditions.   PAST MEDICAL HISTORY:  Obesity and asthma.   PAST SURGICAL HISTORY:  None.   MEDICATIONS:  None.   ALLERGIES:  No known drug allergies.   SOCIAL HISTORY:  The patient is currently employed.  She smokes one-pack  a day and has smoked for several years.  She does not drink any alcohol  or use any illicit drugs.  She denies any past or current history of  sexual or physical abuse.   FAMILY HISTORY:  There is extensive for diabetes, heart disease, high  blood pressure, and unknown type of cancer for her mother.  She said  it  is likely uterus.   REVIEW OF SYSTEMS:  Remarkable for weight gain, fever, and leg cramps.   PHYSICAL EXAMINATION:  VITAL SIGNS:  Blood pressure 131/79, pulse 106,  weight 228 pounds.  Physical exam is deferred today as per patient  request.   ASSESSMENT AND PLAN:  The patient is a 25 year old gravida 0, who is  here for evaluation of irregular bleeding and given her report of having  acne, especially around the time of her periods, and irregular menstrual  periods, she does have 2/3 criteria needed for a diagnosis of polycystic  ovarian syndrome.  The patient also has an obese habitus.  She was told  about this diagnosis and management of it was also discussed in detail  as workup for other etiologies of irregular menstrual bleeding.  The  patient will have laboratory evaluation and also have pelvic ultrasound  to rule out structural anomalies.  When she comes back to discuss the  results in 1-2 weeks  she will have her  full annual examination and also  sexually transmitted infection screen will be done at that visit.  The  patient was counseled regarding the Gardasil vaccination series which  she says she will think about for now, but this will be rediscussed when  she comes in for her annual examination in the next 1-2 weeks.  The  patient was given written information about polycystic ovarian syndrome.  And all her questions were answered.  We will follow up results and  manage accordingly.           ______________________________  Jaynie Collins, MD     UA/MEDQ  D:  08/19/2010  T:  08/20/2010  Job:  [id number]

## 2011-01-19 NOTE — Assessment & Plan Note (Signed)
Audrey Goodwin, Audrey Goodwin               ACCOUNT NO.:  [id number]   MEDICAL RECORD NO.:  [id number]          PATIENT TYPE:  POB   LOCATION:  [facility] at [place]         FACILITY:  [facility]   PHYSICIAN:  Jaynie Collins, MD     DATE OF BIRTH:  1986-03-14   DATE OF SERVICE:  09/01/2010                                  CLINIC NOTE   REASON FOR VISIT:  Follow up results.   Ms. Cotham is a 25 year old gravida 0 who was first evaluated on August 19, 2010, for irregular bleeding.  After an extensive history, it was  deduced that her irregular bleeding is likely secondary to polycystic  ovarian syndrome, but the patient did have follow up laboratory  evaluation to rule out other etiologies for her irregular menstrual  bleeding.  The patient is here today to follow up results.  Also, the  patient was told that at this visit she will be undergoing her annual  Pap smear, but she just started her menstrual period today and wants to  reschedule this.  She was also counseled regarding Gardasil vaccination  series last time which she is interested in starting today.  She denies  any other symptoms.   Laboratory evaluations were remarkable for a TSH of 1.629 which was  normal, but a hemoglobin of 6.5 which was diagnostic of diabetes  mellitus.  Her FSH was normal at 5.1, prolactin was normal at 9.4, and  her hCG was negative.  Total testosterone was elevated at 88.41 which is  diagnostic of an increased androgen level which is one of the criteria  for PCOS.  The patient was scheduled for an ultrasound to be done prior  to this appointment, but she missed her appointment and is scheduled to  have that ultrasound tomorrow to rule out any other structural  anomalies.   These test results were discussed with the patient and emphasized the  significance of having diabetes and PCOS.  She was told that these run  together as part of metabolic syndrome and are associated with obesity.  Weight loss was heavily  recommended and also the patient was told she  will be referred to her primary care physician for further evaluation  and management of her diabetes.  In the meantime, a carbohydrate-  modified diet and exercise would help.  The patient was given options  about management for her PCOS including birth-control pills and other  hormonal modalities.  The patient is willing to take that at this point.  She was also given the option of metformin which she seems to be  interested in.  She was told that this is also a way of treating  diabetes, so it could treat the two conditions simultaneously.  The  patient was written for metformin 800 mg once a day and we will see how  she tolerates this and by her next visit in 2 months, she will have  another hemoglobin A1c checked which will be about 3 months since the  last time it was checked and see if she needs any increase in her  dosage.  Her primary care physician could also determine if she needs  any further increase  in her metformin in addition to good diet and  exercise control.   The patient will receive her first Gardasil injection today and will  return in 2 months for her repeat Gardasil injection.  At that  appointment, she will have a Pap smear and the physical exam done and  reevaluation of her hemoglobin A1c follow up and also a comprehensive  metabolic panel to make sure that metformin is not having any adverse  effects to her kidneys or her liver.  We will also follow up her primary  care physician's recommendations for further evaluation and management  of her diabetes.  The patient was told that she will be called with the  results of  the ultrasound which will be done tomorrow.  She was told to call or  come back in for any further gynecologic concerns.           ______________________________  Jaynie Collins, MD     UA/MEDQ  D:  09/01/2010  T:  09/02/2010  Job:  [id number]

## 2011-01-22 NOTE — Discharge Summary (Signed)
[facility]  Patient:    Audrey Goodwin, Audrey Goodwin Visit Number: [id number] MRN: [id number]          Service Type: PSY Location: 100 0101 02 Attending Physician:  Veneta Penton Dictated by:   Veneta Penton, M.D. Admit Date:  10/16/2001 Disc. Date: 10/24/01                             Discharge Summary  REASON FOR ADMISSION:  This 25 year old white female was admitted complaining of depression with suicidal ideation, status post suicide attempt to cut her wrists.  For further history of present illness, please see the patients psychiatric admission assessment.  PHYSICAL EXAMINATION:  At the time of admission was significant for obesity and a history of asthma, as well as a cushinoid appearance probably secondary to her use of steroids for her asthma in the past.  LABORATORY EXAMINATION:  The patient underwent a laboratory work-up to rule out any other medical problems contributing to her symptomatology.  A urine probe for gonorrhea and chlamydia were negative.  A urine drug screen was negative.  CBC showed a platelet count of 349 thousand and was otherwise unremarkable.  Routine chem panel was within normal limits.  Hepatic panel was within normal limits.   GGT was within normal limits.  TSH was 3.368, free T4 was 0.82.  Urine pregnancy test was negative.  A UA was remarkable only for calcium oxalate crystals with few bacteria and a negative nitrite.  An RPR was nonreactive.  The patient received no x-rays, no special procedures, no additional consultations.  She sustained no complications during the course of this hospitalization.  HOSPITAL COURSE:  On admission, the patient presented as psychomotor agitated, with increased startle response, increased autonomic arousal, poor impulse control, decreased concentration.  Her affect and mood were depressed, irritable, and angry.  She was superficial, guarded, continued throughout the course of this  hospitalization to attempt to focus on the issues of her peers rather on her own issues.  However, at present time she denies any homicidal or suicidal ideation, has been actively participating in all aspects of the therapeutic treatment program, denies any plans to harm herself or others, has displayed no evidence of thought disorder throughout her hospital course, and consequently is felt to have reached her maximum benefits of hospitalization and is ready for discharge to a less restricted alternative setting.  CONDITION ON DISCHARGE:  Improved  FINAL DIAGNOSIS: Axis I:    1. Major depression, recurrent, severe, without psychosis.            2. Post traumatic stress disorder.            3. Conduct disorder.            4. Rule out attention deficit hyperactivity disorder.            5. Rule out intermittent explosive disorder. Axis II:   1. Rule out personality disorder not otherwise specified.            2. Rule out learning disorder not otherwise specified. Axis III:  1. Asthma.            2. Obesity. Axis IV:   Current psychosocial stressors are severe. Axis V:    Code 20 on admission, code 30 on discharge.  FURTHER EVALUATION AND TREATMENT RECOMMENDATIONS: 1. The patient is discharged to home. 2. She is discharged on an unrestricted level of activity and a  regular diet. 3. She will follow up with Dr. Sherilyn Dacosta, her outpatient psychiatrist, for all    further aspects of her psychiatric care and consequently I will sign off    on the case at this time.  She will follow up with [facility]    for all further therapy and evaluation and treatment of her medical    problems.  DISCHARGE MEDICATIONS: 1. Trazodone 50 mg p.o. q.h.s. 2. Effexor XR 225 mg p.o. q.a.m. with food. Dictated by:   Veneta Penton, M.D. Attending Physician:  Veneta Penton DD:  10/24/01 TD:  10/24/01 Job: [id number]

## 2011-01-22 NOTE — Discharge Summary (Signed)
[facility]  Patient:    Audrey Goodwin, Audrey Goodwin Visit Number: [id number] MRN: [id number]          Service Type: PSY Location: 100 0102 01 Attending Physician:  Veneta Penton. Dictated by:   Veneta Penton, M.D. Admit Date:  06/22/2001 Discharge Date: 07/04/2001                             Discharge Summary  REASON FOR ADMISSION:  This 25 year old Hispanic female from [place] was admitted for inpatient psychiatric stabilization after she became agitated and threatened to kill her mother.  For further history of present illness, please see the patients psychiatric admission assessment.  PHYSICAL EXAMINATION:  Her physical examination at the time of admission was significant for the patient being obese.  She also had old self-inflicted injury marks on her arms.  She had an otherwise unremarkable physical examination.  LABORATORY EXAMINATION:  The patient underwent a laboratory work-up to rule out any medical problems contributing to her symptomatology.  A TSH was within normal limits.  Urine drug screen was negative.  Urine pregnancy test was negative.  UA was unremarkable.  A urine probe for gonorrhea and chlamydia were negative.  CBC was remarkable for an MCV of 81.5, RDW of 14.1%, platelet count of 373,000, and was otherwise unremarkable.  An eosinophil count was 7%, differential was otherwise unremarkable.  Basic metabolic panel was within normal limits.  Free T4 was within normal limits.  Hepatic panel was within normal limits.  The patient received no x-rays, no special procedures, no additional consultations.  She sustained no complications during the course of this hospitalization.  HOSPITAL COURSE:  On admission, the patients affect and mood were depressed and irritable and concentration was decreased.  She was psychomotor retarded. She gradually adapted to unit routine, socializing well with both patients and staff.  She was begun on  a trial of Effexor XR and titrated up to a therapeutic dose.  She complained of insomnia and to her current drug regimen was added Remeron.  She tolerated these medications well without any side effects.  At the time of discharge, her affect and mood have improved.  She is participating in all aspects of the therapeutic treatment program.  She denies any homicidal or suicidal ideation.  Her activities of daily living have reached the point where she is able to care for herself.  She no longer appears to be danger to herself or others.  She is motivated for outpatient therapy and consequently is felt to have reached her maximum benefits of hospitalization and is ready for discharge to a less restricted alternative setting.  CONDITION ON DISCHARGE:  Improved  FINAL DIAGNOSIS: Axis I:    1. Major depression, single episode, severe, without psychosis.            2. Rule out attention deficit hyperactivity disorder.            3. Oppositional-defiant disorder. Axis II:   1. Rule out personality disorder not otherwise specified.            2. Rule out learning disorder not otherwise specified. Axis III:  Obesity. Axis IV:   Current psychosocial stressors are severe. Axis V:    Code 20 on admission, code 30 on discharge.  FURTHER EVALUATION AND TREATMENT RECOMMENDATIONS: 1. The patient is discharged to the care of her group home. 2. She is discharged on an unrestricted level of activity and  a regular diet. 3. She will follow up with the psychiatric consultant at [facility] group    home for all further aspects of her psychiatric care and consequently I    will sign off on the case at this time.  DISCHARGE MEDICATIONS: 1. Effexor XR 75 mg p.o. q.a.m. with food. 2. Remeron soltabs 15 mg p.o. q.h.s. Dictated by:   Veneta Penton, M.D. Attending Physician:  Veneta Penton DD:  07/04/01 TD:  07/04/01 Job: [id number]
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
## 2011-02-06 IMAGING — US US PELVIS COMPLETE MODIFY
1 series · 13 of 25 positions shown · non-contrast
Comparison: None.

CLINICAL DATA: Irregular menses.  Insulin dependent diabetes.  LMP
09/01/2010



[Series 1: us pelvis complete modify · 0.24mm/px · 13 of 46 slices shown]
[im 1/46]
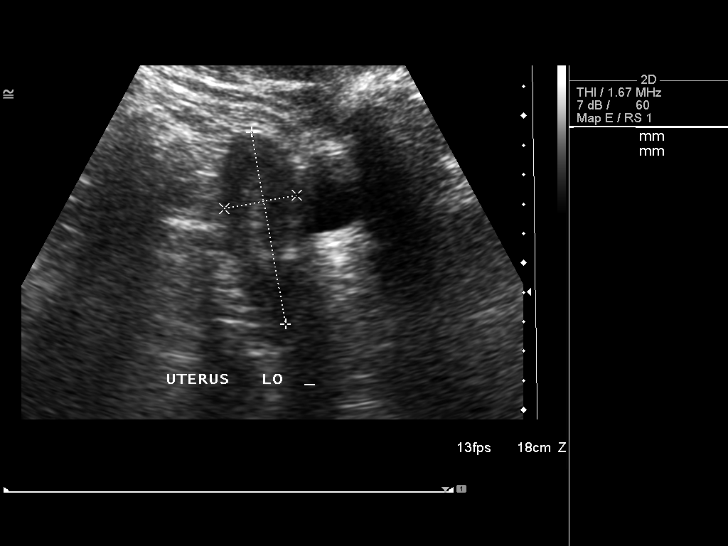
[im 4/46]
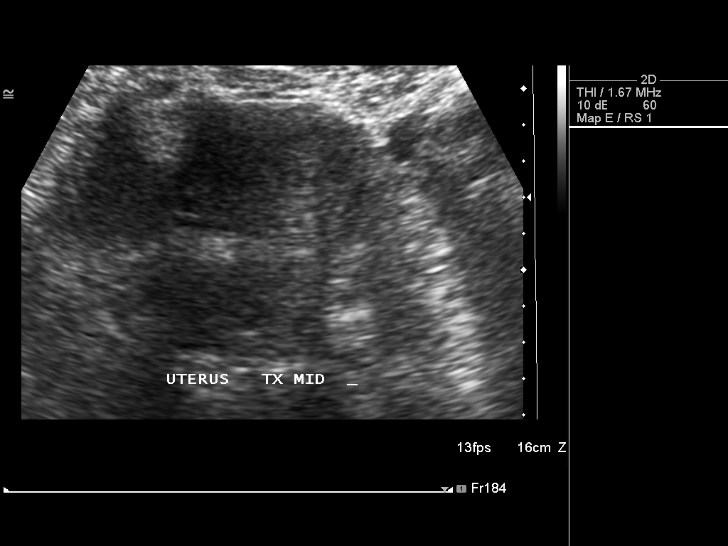
[im 8/46]
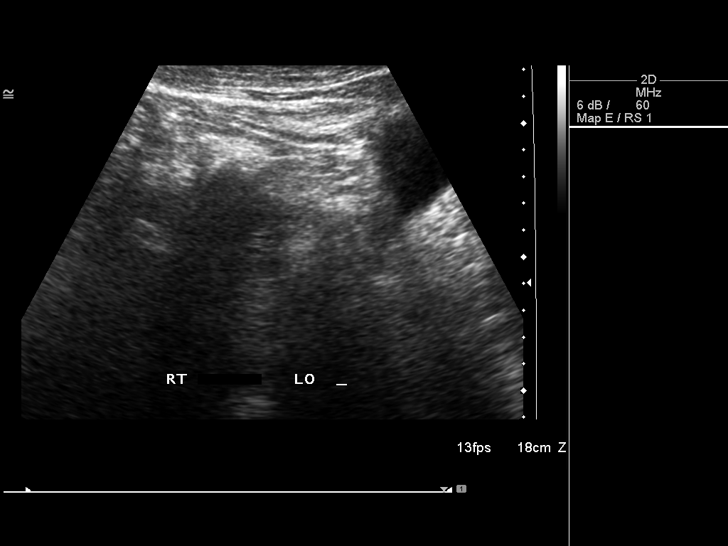
[im 12/46]
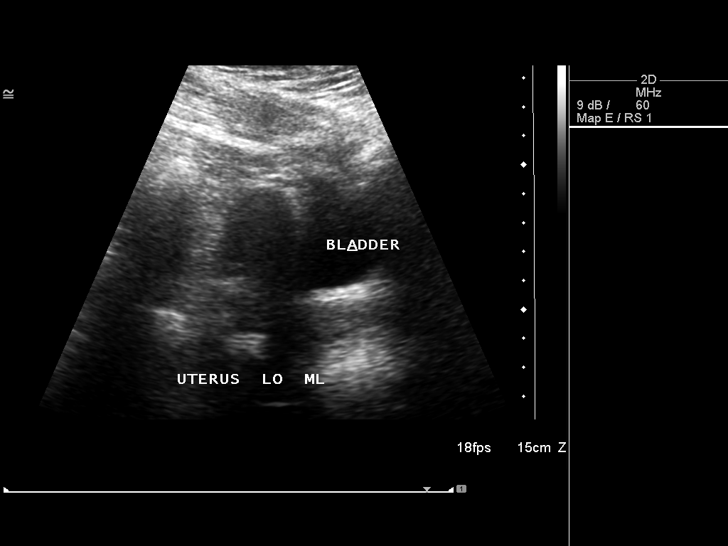
[im 16/46]
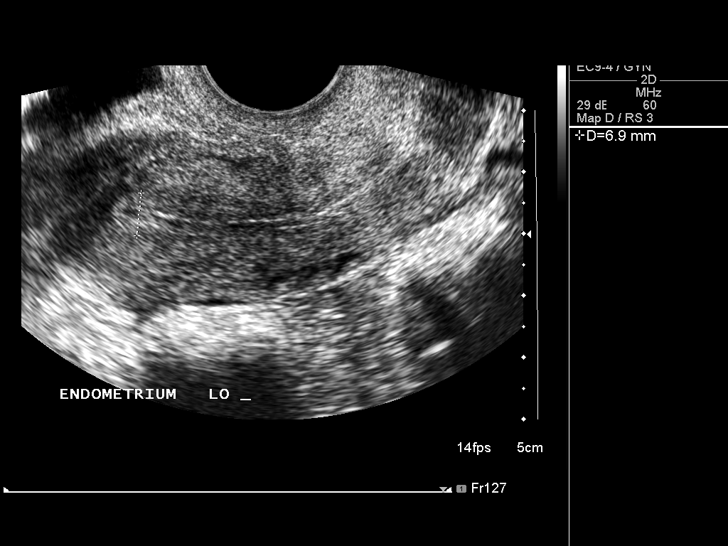
[im 19/46]
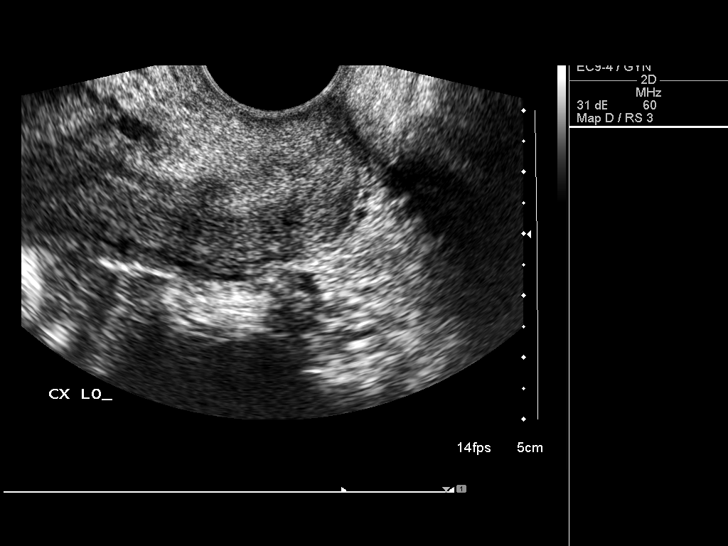
[im 23/46]
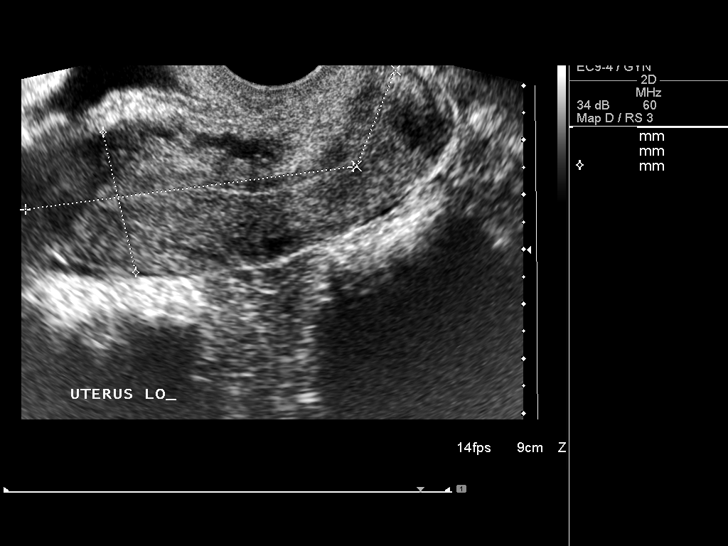
[im 27/46]
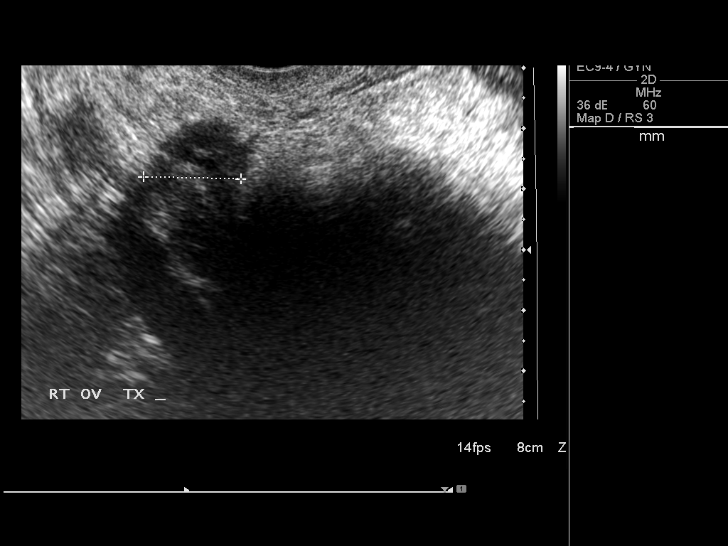
[im 31/46]
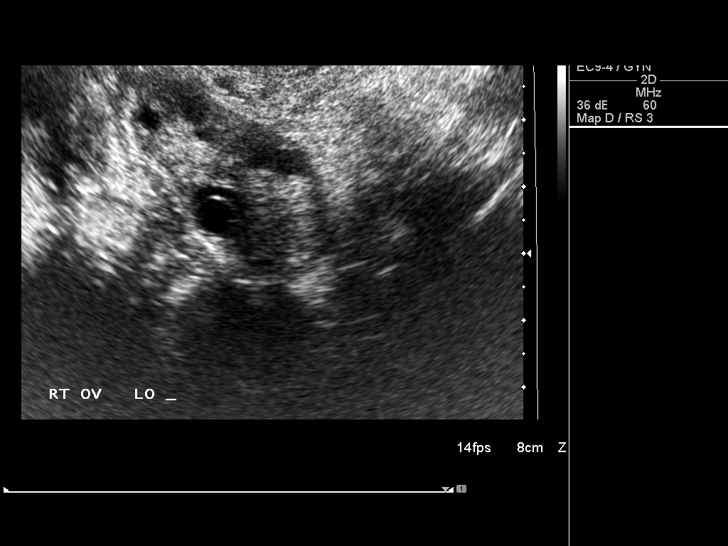
[im 34/46]
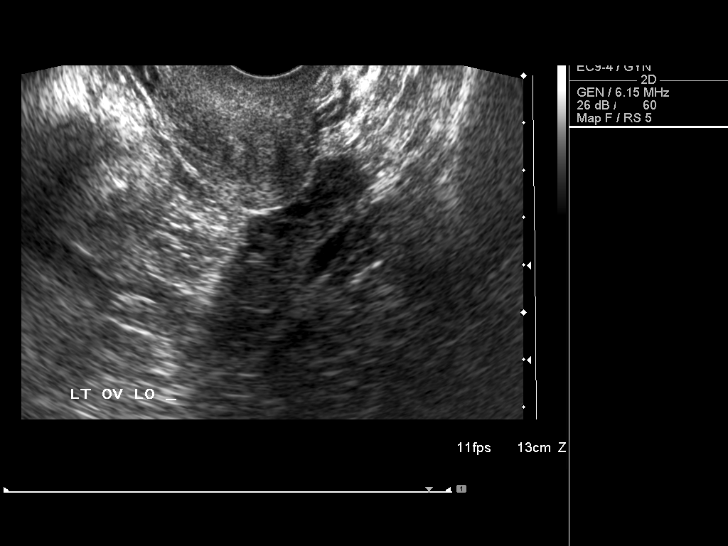
[im 38/46]
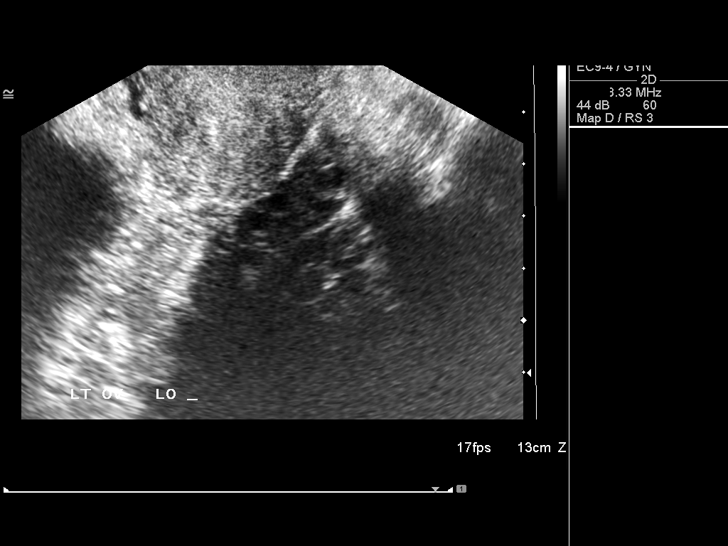
[im 42/46]
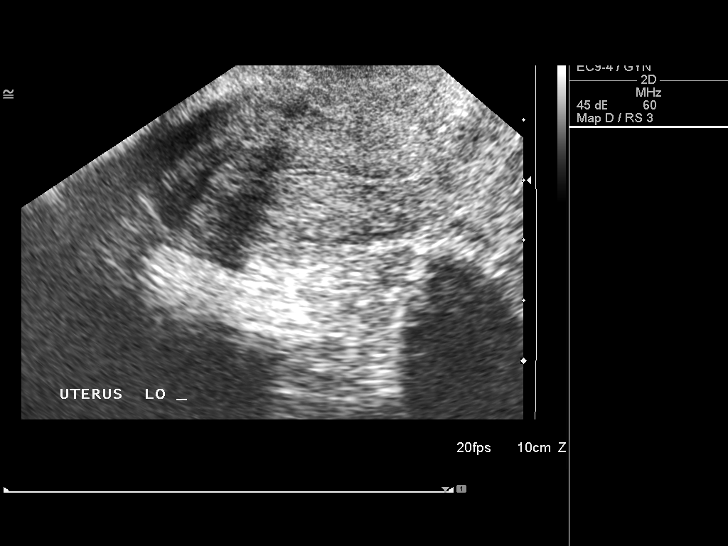
[im 46/46]
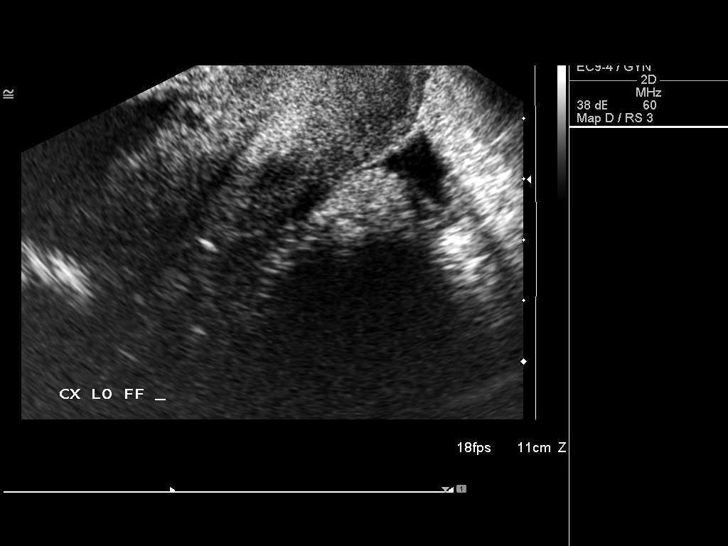

[13 of 25 positions shown; findings below may reference images not displayed]

FINDINGS: Uterus is anteflexed and demonstrates a sagittal length of 8.0 cm,
an AP depth of 3.0 cm and a transverse width of 4.4 cm.  A
homogeneous uterine myometrium is seen

Endometrium has an early tri- layered appearance with an AP width
of 5.4 mm.  No areas of focal thickening or heterogeneity are seen
and this would correlate with a periovulatory endometrial stripe
and correspond with the patient's given LMP of 09/01/2010.

Right Ovary has a normal appearance measuring 3.9 x 2.4 x 1.5 cm

Left Ovary has a normal appearance measuring 3.9 x 1.5 x 1.7 cm

Other Findings:  A trace of simple free fluid is noted in the
pelvis.  No adnexal masses are seen.
IMPRESSION: Unremarkable early periovulatory pelvic ultrasound.

## 2011-03-25 IMAGING — CR DG CHEST 2V
2 series · 2 of 2 positions shown · non-contrast
Comparison: None.

CLINICAL DATA: Positive TB skin test, smoking history, some
shortness of breath

CHEST - 2 VIEW

[view not recorded (1 of 2)]
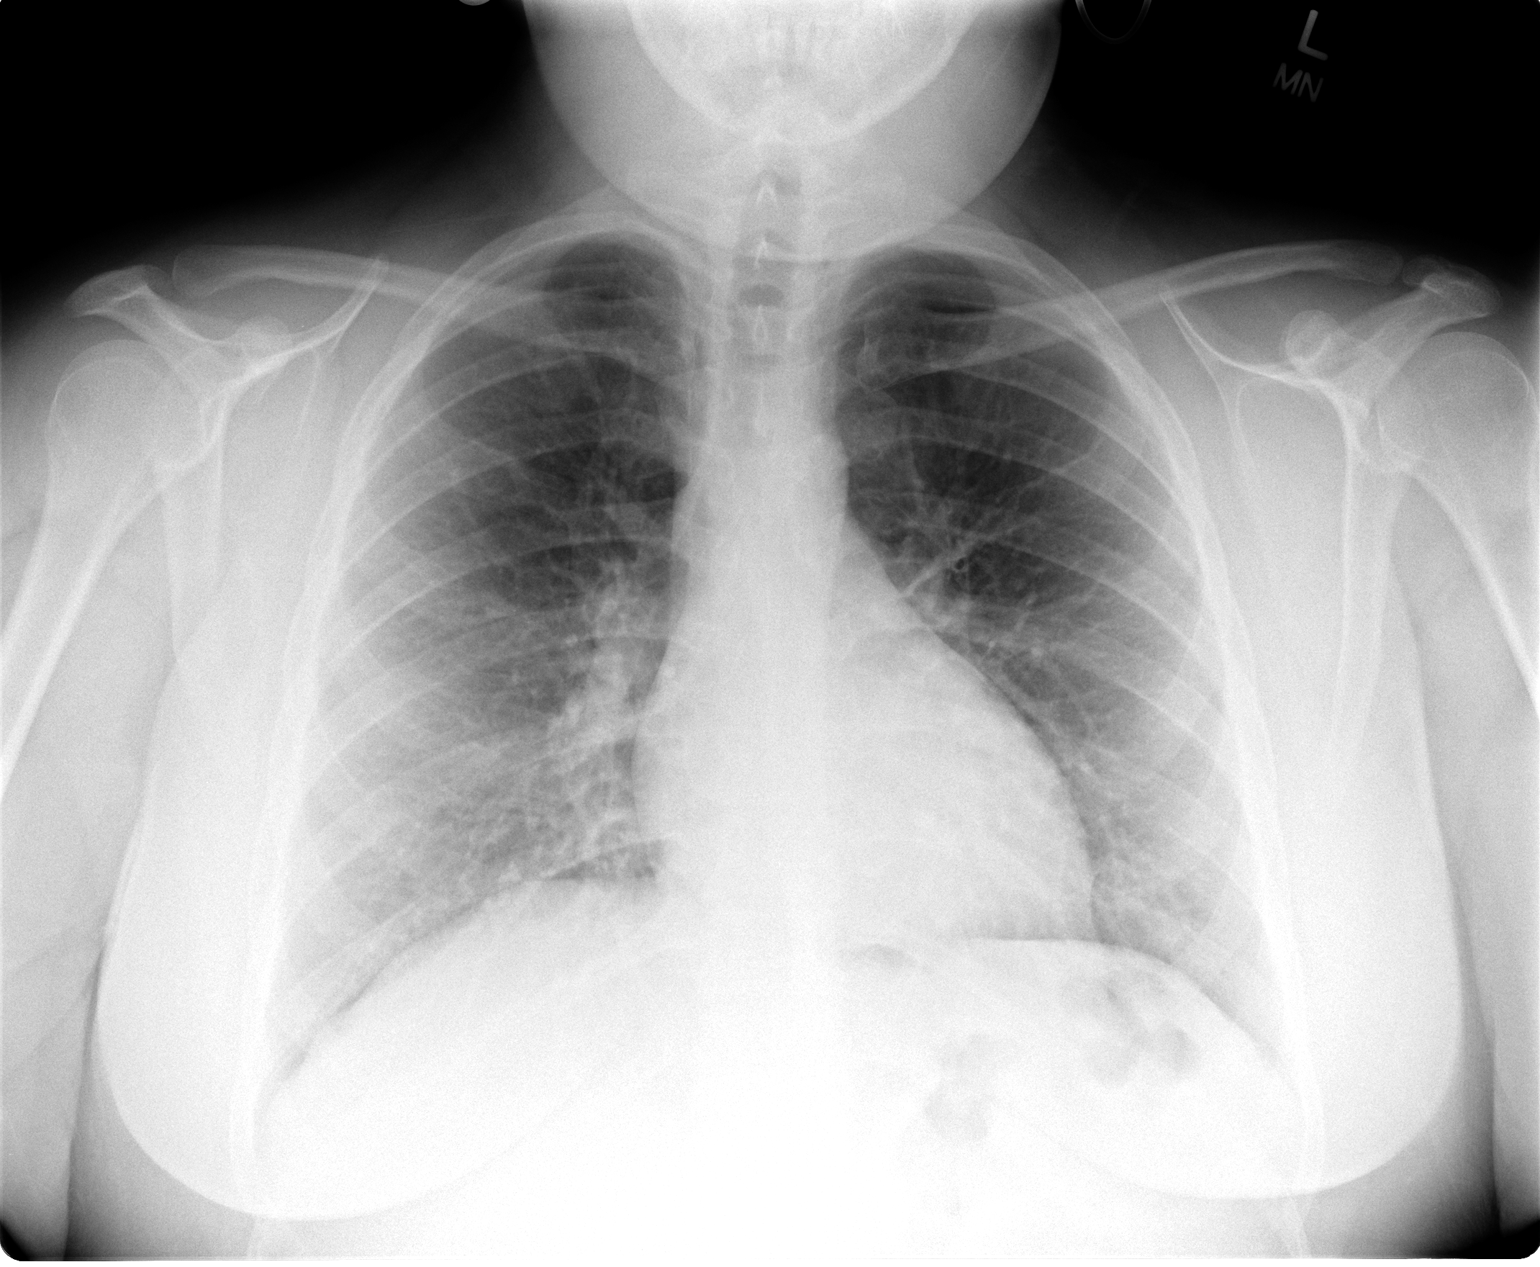

[view not recorded (2 of 2)]
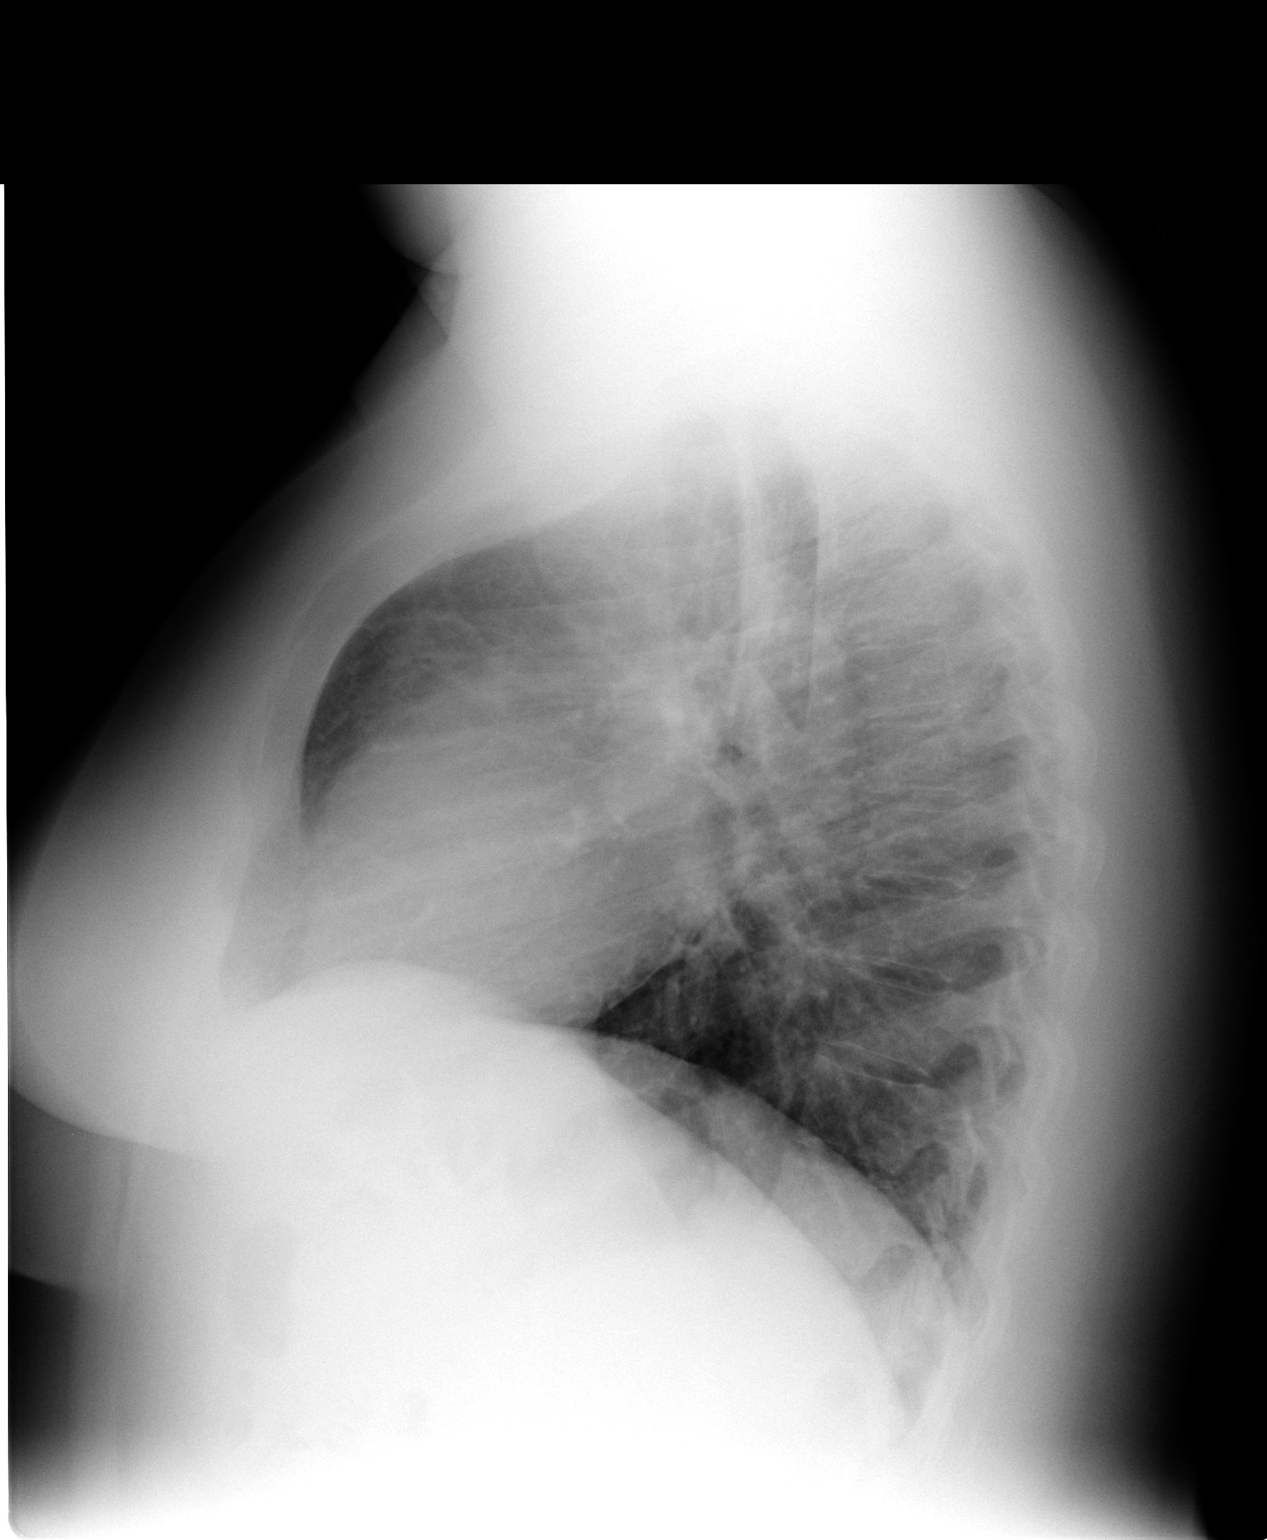

[2 of 2 positions shown; findings below may reference images not displayed]

FINDINGS: No active infiltrate or effusion is seen.  There is
peribronchial thickening which may indicate bronchitis.
Mediastinal contours are normal.  The heart is within normal limits
in size.  No bony abnormality is seen.
IMPRESSION: No active lung disease.  Peribronchial thickening may indicate
bronchitis.

## 2011-05-14 ENCOUNTER — Encounter: Payer: Self-pay | Admitting: Emergency Medicine

## 2011-05-14 ENCOUNTER — Inpatient Hospital Stay (INDEPENDENT_AMBULATORY_CARE_PROVIDER_SITE_OTHER)
Admission: RE | Admit: 2011-05-14 | Discharge: 2011-05-14 | Disposition: A | Discharge status: Home / Self Care | Payer: BC Managed Care – PPO | Source: Ambulatory Visit | Attending: Emergency Medicine | Admitting: Emergency Medicine

## 2011-05-14 DIAGNOSIS — R109 Unspecified abdominal pain: Secondary | ICD-10-CM | POA: Insufficient documentation

## 2011-05-14 LAB — CONVERTED CEMR LAB
Beta hcg, urine, semiquantitative: NEGATIVE
Bilirubin Urine: NEGATIVE
Protein, U semiquant: NEGATIVE
Specific Gravity, Urine: 1.025
Urobilinogen, UA: 0.2
WBC Urine, dipstick: NEGATIVE

## 2011-05-18 ENCOUNTER — Other Ambulatory Visit: Payer: Self-pay | Admitting: Family Medicine

## 2011-05-18 ENCOUNTER — Encounter: Payer: Self-pay | Admitting: Family Medicine

## 2011-05-18 ENCOUNTER — Telehealth (INDEPENDENT_AMBULATORY_CARE_PROVIDER_SITE_OTHER): Payer: Self-pay | Admitting: *Deleted

## 2011-05-19 ENCOUNTER — Telehealth (INDEPENDENT_AMBULATORY_CARE_PROVIDER_SITE_OTHER): Payer: Self-pay | Admitting: *Deleted

## 2011-05-19 ENCOUNTER — Ambulatory Visit
Admission: RE | Admit: 2011-05-19 | Discharge: 2011-05-19 | Disposition: A | Discharge status: Home / Self Care | Payer: BC Managed Care – PPO | Source: Ambulatory Visit | Attending: Family Medicine | Admitting: Family Medicine

## 2011-05-19 ENCOUNTER — Telehealth: Payer: Self-pay | Admitting: Family Medicine

## 2011-05-19 NOTE — Telephone Encounter (Signed)
I didn't order an [place] on her. I think Urgent care did. She needs to call them for the result.

## 2011-05-19 NOTE — Telephone Encounter (Signed)
Pt informed that our office did not order a ultrasound on her, but it looks like the UC may have. Plan:  Pt told to call the UC for the result. Jarvis Newcomer, LPN [facility]

## 2011-05-19 NOTE — Telephone Encounter (Signed)
Pt called for ultrasound results of her stomach.    Plan:  Retrieved report, but it looks like the provider has not reviewed.  Will send this reminder to the provider. Told the pt that will double check for the report and contact her once provider has reviewed. Jarvis Newcomer, LPN [facility]

## 2011-05-20 ENCOUNTER — Telehealth (INDEPENDENT_AMBULATORY_CARE_PROVIDER_SITE_OTHER): Payer: Self-pay | Admitting: *Deleted

## 2011-05-28 ENCOUNTER — Encounter: Payer: Self-pay | Admitting: Family Medicine

## 2011-05-28 ENCOUNTER — Ambulatory Visit (INDEPENDENT_AMBULATORY_CARE_PROVIDER_SITE_OTHER): Payer: BC Managed Care – PPO | Admitting: Family Medicine

## 2011-05-28 DIAGNOSIS — E119 Type 2 diabetes mellitus without complications: Secondary | ICD-10-CM

## 2011-05-28 DIAGNOSIS — K8020 Calculus of gallbladder without cholecystitis without obstruction: Secondary | ICD-10-CM | POA: Insufficient documentation

## 2011-05-28 DIAGNOSIS — K5900 Constipation, unspecified: Secondary | ICD-10-CM

## 2011-05-28 LAB — POCT GLYCOSYLATED HEMOGLOBIN (HGB A1C): Hemoglobin A1C: 6.6

## 2011-05-28 MED ORDER — METFORMIN HCL 500 MG PO TABS: 500.0000 mg | ORAL_TABLET | Freq: Every day | ORAL | Status: DC

## 2011-05-28 MED ORDER — AMBULATORY NON FORMULARY MEDICATION: Status: AC

## 2011-05-28 NOTE — Progress Notes (Signed)
  Subjective:    Patient ID: Audrey Goodwin, female    DOB: 01/17/1986, 25 y.o.   MRN: [id number]  HPI  DM - doesn't really feeling any low.  Med makes her feel dehydrated.  Sometimes has diarrhea with it.   Has been to urgent care recently and dx with GB stones.  This was about 2 weeks ago. She hs been schedule with the surgeon.  Has met the surgeon but hasn't schedule the surgery yet.  Has felt more constipated this last week.  No bloood in the stool.   Review of Systems     Objective:   Physical Exam  Constitutional: She is oriented to person, place, and time. She appears well-developed and well-nourished.       She is obese.   HENT:  Head: Normocephalic and atraumatic.  Cardiovascular: Normal rate, regular rhythm and normal heart sounds.   Pulmonary/Chest: Effort normal and breath sounds normal.  Neurological: She is alert and oriented to person, place, and time.  Skin: Skin is warm and dry.  Psychiatric: She has a normal mood and affect.          Assessment & Plan:  DM- A1C is 6.6 which is up fom 6.0. F.U in 4 months. Foot exam performed today.  Declined flu vaccine. Discussed getting back on track with her diet and exercise.  RF med.   Gall stones - She plans to schedule her cholecystectomy soon. Continue with bland diet.   Constipation, recent - Recommend Miralax bid until soft stool, work on increasing fiber in your diet but be careful with the gallstones.

## 2011-05-28 NOTE — Patient Instructions (Signed)
Miralax use that twice a day until you have bowel movement.

## 2011-07-06 ENCOUNTER — Ambulatory Visit: Payer: BC Managed Care – PPO | Admitting: Family Medicine

## 2011-08-09 NOTE — Telephone Encounter (Signed)
  Phone Note Outgoing Call   Call placed by: Clemens Catholic LPN,  May 19, 2011 4:56 PM Summary of Call: spoke to pt advised her of abd u/s resutls, dr Orson Aloe reccomends that she see general surgery to evaluate and for possible further testing. advised the pt we would sch appt and call her with the information tomorrow. Initial call taken by: Clemens Catholic LPN,  May 19, 2011 5:11 PM

## 2011-08-09 NOTE — Progress Notes (Signed)
Summary: New medication  Trial of omeprazole. Schedule GB US.  Prescriptions: OMEPRAZOLE 40 MG CPDR (OMEPRAZOLE) One by mouth Qam 30 min AC  #15 x 1   Entered and Authorized by:   Donna Christen MD   Signed by:   Donna Christen MD on 05/18/2011   Method used:   Printed then faxed to ...       [facility]* (retail)       [address] [place], [place]  [address]       Ph: [phone]       Fax: [phone]   RxID:   [id number]  Donna Christen MD  May 18, 2011 1:28 PM

## 2011-08-09 NOTE — Telephone Encounter (Signed)
  Phone Note Call from Patient   Caller: Patient Summary of Call: pt called and states that she is no better, still has lots of bloating and soreness in her abdomen. she states that she can not get an appt with her PCP, "they are busy".   04/17/11- left message for pts dad to bring Audrey Goodwin in for a f/u appt here or with his peds MD./Raigan Baria,LPN Initial call taken by: Clemens Catholic LPN,  May 18, 2011 12:55 PM Call placed by: Clemens Catholic LPN,  May 18, 2011 12:55 PM     Appended Document:  04/17/11- documented 04/17/11 note on wrong pts chart./C.Deidra Spease,LPN  Appended Document:  All labs normal except elevated glucose and Hgb A1c. Recommend clear liquids through tomorrow morning then advance diet. Trial of omeprazole 40mg  qam. Schedule GB ultrasound.  Appended Document:  04/17/11- appt sch'ed for abd u/s @ [facility] 05/19/11 @ 10:45am, rx faxed to [facility], and labs sent to PCP./c.Beauford Lando,lpn
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
## 2011-08-09 NOTE — Telephone Encounter (Signed)
  Phone Note Outgoing Call   Call placed by: Lajean Saver RN,  May 20, 2011 9:11 AM Call placed to: Patient Action Taken: Phone Call Completed, Appt scheduled Summary of Call: Patient scheduled with gerneral surgery. [facility], Dr. Ashley Jacobs in [facility] September 17/2012 @ 2pm. Patient notified of appointment.

## 2011-08-24 ENCOUNTER — Encounter: Payer: Self-pay | Admitting: Family Medicine

## 2011-08-26 ENCOUNTER — Ambulatory Visit: Payer: BC Managed Care – PPO | Admitting: Family Medicine

## 2011-08-26 DIAGNOSIS — Z0289 Encounter for other administrative examinations: Secondary | ICD-10-CM

## 2011-12-15 ENCOUNTER — Ambulatory Visit (INDEPENDENT_AMBULATORY_CARE_PROVIDER_SITE_OTHER): Payer: BC Managed Care – PPO | Admitting: Family Medicine

## 2011-12-15 ENCOUNTER — Encounter: Payer: Self-pay | Admitting: Family Medicine

## 2011-12-15 DIAGNOSIS — R232 Flushing: Secondary | ICD-10-CM

## 2011-12-15 DIAGNOSIS — N951 Menopausal and female climacteric states: Secondary | ICD-10-CM

## 2011-12-15 DIAGNOSIS — J351 Hypertrophy of tonsils: Secondary | ICD-10-CM

## 2011-12-15 DIAGNOSIS — R21 Rash and other nonspecific skin eruption: Secondary | ICD-10-CM

## 2011-12-15 DIAGNOSIS — E119 Type 2 diabetes mellitus without complications: Secondary | ICD-10-CM

## 2011-12-15 LAB — POCT GLYCOSYLATED HEMOGLOBIN (HGB A1C): Hemoglobin A1C: 6.8

## 2011-12-15 MED ORDER — GLIMEPIRIDE 2 MG PO TABS: 2.0000 mg | ORAL_TABLET | Freq: Every day | ORAL | Status: DC

## 2011-12-15 MED ORDER — METFORMIN HCL 1000 MG PO TABS: 1000.0000 mg | ORAL_TABLET | Freq: Every day | ORAL | Status: DC

## 2011-12-15 MED ORDER — TRIAMCINOLONE ACETONIDE 0.5 % EX OINT: TOPICAL_OINTMENT | Freq: Two times a day (BID) | CUTANEOUS | Status: AC

## 2011-12-15 NOTE — Progress Notes (Signed)
  Subjective:    Patient ID: Audrey Goodwin, female    DOB: 09-13-1985, 26 y.o.   MRN: [id number]  HPI DM- Recently quit smoking.  Says her vision has been off.  Has been taking her medication regularly.  Face has been breaking out, acne.  Got injured on her job and having back problem and shoulder pain. She has been out of work because of this. Has a minor tear on her rotator cuff. Has been trying to walk on the treadmill for exercise and she cannot lift weights.  She denies any hypoglycemic events. She says she takes her medication most the time. She does occasionally forget.  She also complains of a rash that she's noticed a couple weeks ago.  Rash is itchy on her right hand inbetween her fingers. Had on her left hand 2 weeks ago but that has resolved. Around a lot of chemicals at work.  She says she washes her hands regularluy. She's been putting lotion on it but no other medications.  She wants to know if she could be a candidate to have her tonsils removed.  Review of Systems     Objective:   Physical Exam  Constitutional: She is oriented to person, place, and time. She appears well-developed and well-nourished.  HENT:  Head: Normocephalic and atraumatic.  Eyes: Conjunctivae are normal. Pupils are equal, round, and reactive to light.  Neck: Neck supple. No thyromegaly present.  Cardiovascular: Normal rate, regular rhythm and normal heart sounds.   Pulmonary/Chest: Effort normal and breath sounds normal.  Lymphadenopathy:    She has no cervical adenopathy.  Neurological: She is alert and oriented to person, place, and time.  Skin: Skin is warm and dry.       She has some dry scaling papules on her right hand near her ring.    Psychiatric: She has a normal mood and affect. Her behavior is normal.          Assessment & Plan:  DM- still under control but her A1c is up to 6.8 from last time. I will increase her metformin 2000 mg twice a day. We will continue the glyburide. Followup  in 3 months. Keep up the regular exercise and continue to work on diet. I really think weight loss would make a significant improvement in her numbers and help reduce her need for medication.  Eczema - Will tx with topical steorid cream.  Call if nto resolving. Scabies is a consideration since it is in the web spaces of her fingers but since it is concentrated around her ring area I suspect this could be more of a contact dermatitis.  Hot flashes - will check TSH.   She would also like to see ENT for enlarged tonsils. She says they get very swollen when she is sick and caused a lot of obstruction or problems. She's been told by her mother that she snores like a bear him his nightly. She would like to have them out if possible. She does not have a history of recurrent strep pharyngitis. She does not have a sore throat today.  Tobacco abuse-I congratulated her on smoking cessation.

## 2011-12-16 LAB — COMPLETE METABOLIC PANEL WITH GFR
AST: 25 U/L (ref 0–37)
Alkaline Phosphatase: 78 U/L (ref 39–117)
Calcium: 8.7 mg/dL (ref 8.4–10.5)
Sodium: 139 mEq/L (ref 135–145)
Total Bilirubin: 0.3 mg/dL (ref 0.3–1.2)
Total Protein: 6.7 g/dL (ref 6.0–8.3)

## 2012-02-24 ENCOUNTER — Ambulatory Visit: Payer: BC Managed Care – PPO | Admitting: Family Medicine

## 2012-02-24 DIAGNOSIS — Z0289 Encounter for other administrative examinations: Secondary | ICD-10-CM

## 2012-03-16 ENCOUNTER — Ambulatory Visit: Payer: BC Managed Care – PPO | Admitting: Family Medicine

## 2012-03-16 DIAGNOSIS — Z0289 Encounter for other administrative examinations: Secondary | ICD-10-CM

## 2012-04-03 ENCOUNTER — Encounter: Payer: BC Managed Care – PPO | Admitting: Family Medicine

## 2012-04-03 ENCOUNTER — Other Ambulatory Visit: Payer: Self-pay | Admitting: *Deleted

## 2012-04-03 MED ORDER — GLIMEPIRIDE 2 MG PO TABS: 2.0000 mg | ORAL_TABLET | Freq: Every day | ORAL | Status: AC

## 2012-04-03 MED ORDER — METFORMIN HCL 1000 MG PO TABS: 1000.0000 mg | ORAL_TABLET | Freq: Every day | ORAL | Status: AC

## 2012-04-07 ENCOUNTER — Encounter: Payer: BC Managed Care – PPO | Admitting: Family Medicine

## 2013-03-15 ENCOUNTER — Other Ambulatory Visit: Payer: Self-pay

## 2015-04-20 ENCOUNTER — Emergency Department
Admission: EM | Admit: 2015-04-20 | Discharge: 2015-04-20 | Disposition: A | Discharge status: Home / Self Care | Payer: Self-pay | Source: Home / Self Care | Attending: Family Medicine | Admitting: Family Medicine

## 2015-04-20 ENCOUNTER — Encounter: Payer: Self-pay | Admitting: *Deleted

## 2015-04-20 DIAGNOSIS — L298 Other pruritus: Secondary | ICD-10-CM

## 2015-04-20 DIAGNOSIS — R1084 Generalized abdominal pain: Secondary | ICD-10-CM

## 2015-04-20 DIAGNOSIS — E119 Type 2 diabetes mellitus without complications: Secondary | ICD-10-CM

## 2015-04-20 DIAGNOSIS — N898 Other specified noninflammatory disorders of vagina: Secondary | ICD-10-CM

## 2015-04-20 HISTORY — DX: Type 2 diabetes mellitus without complications: E11.9

## 2015-04-20 LAB — POCT URINALYSIS DIP (MANUAL ENTRY)
Bilirubin, UA: NEGATIVE
Blood, UA: NEGATIVE
Glucose, UA: 500 — AB
Leukocytes, UA: NEGATIVE
Nitrite, UA: NEGATIVE
Protein Ur, POC: 100 — AB
Spec Grav, UA: 1.02 (ref 1.005–1.03)
Urobilinogen, UA: 0.2 (ref 0–1)
pH, UA: 5.5 (ref 5–8)

## 2015-04-20 LAB — POCT CBC W AUTO DIFF (K'VILLE URGENT CARE)

## 2015-04-20 LAB — COMPLETE METABOLIC PANEL WITH GFR
ALT: 17 U/L (ref 6–29)
AST: 12 U/L (ref 10–30)
Albumin: 4.4 g/dL (ref 3.6–5.1)
Alkaline Phosphatase: 91 U/L (ref 33–115)
BUN: 8 mg/dL (ref 7–25)
CO2: 21 mmol/L (ref 20–31)
Calcium: 9.6 mg/dL (ref 8.6–10.2)
Chloride: 100 mmol/L (ref 98–110)
Creat: 0.58 mg/dL (ref 0.50–1.10)
GFR, Est African American: >89 mL/min (ref >=60)
GFR, Est Non African American: >89 mL/min (ref >=60)
Glucose, Bld: 303 mg/dL — ABNORMAL HIGH (ref 65–99)
Potassium: 4.3 mmol/L (ref 3.5–5.3)
Sodium: 135 mmol/L (ref 135–146)
Total Bilirubin: 0.4 mg/dL (ref 0.2–1.2)
Total Protein: 7.3 g/dL (ref 6.1–8.1)

## 2015-04-20 LAB — POCT FASTING CBG KUC MANUAL ENTRY: POCT Glucose (KUC): 297 mg/dL — AB (ref 70–99)

## 2015-04-20 MED ORDER — FLUCONAZOLE 200 MG PO TABS: 200.0000 mg | ORAL_TABLET | Freq: Every day | ORAL | Status: AC

## 2015-04-20 MED ORDER — SULFAMETHOXAZOLE-TRIMETHOPRIM 800-160 MG PO TABS: 1.0000 | ORAL_TABLET | Freq: Two times a day (BID) | ORAL | Status: AC

## 2015-04-20 MED ORDER — METRONIDAZOLE 500 MG PO TABS: 500.0000 mg | ORAL_TABLET | Freq: Three times a day (TID) | ORAL | Status: AC

## 2015-04-20 NOTE — ED Provider Notes (Signed)
CSN: [id number]     Arrival date & time 04/20/15  1437 History   First MD Initiated Contact with Patient 04/20/15 1449     Chief Complaint  Patient presents with  . Vaginal Discharge   (Consider location/radiation/quality/duration/timing/severity/associated sxs/prior Treatment) HPI  The patient is a 29 year old female with history of uncomplicated diabetes on metformin and glimepiride, polycystic ovarian syndrome and asthma presenting to urgent care with complaints of diffuse cramping abdominal pain with associated mild intermittent watery diarrhea for the last 1 week.  Patient also complaining of vaginal discharge that is thick and white associated with mild burning and irritation and vaginal opening.  Pain is worse when she urinates.  Denies back pain.  Denies fevers, chills, nausea or vomiting.  Patient states she did use over-the-counter Monistat yesterday, but no relief.  Patient denies concern for STDs as he has not been sexually active since 2008.  Denies recent antibiotic use or recent swimming.  Patient states she is unable to monitor her blood glucose at home as it is too expensive for her.  Patient is unsure what her last CBG was.  Denies history of DKA.  Denies history of ovarian cyst, fibroids.  LMP 2-3 weeks ago.  Abdominal surgical history significant for cholecystectomy.  Past Medical History  Diagnosis Date  . Asthma   . Polycystic ovarian syndrome   . Diabetes mellitus without complication    Past Surgical History  Procedure Laterality Date  . Cholecystectomy     Family History  Problem Relation Age of Onset  . Leukemia Mother   . Cancer Mother     leukemia   Social History  Substance Use Topics  . Smoking status: Never Smoker   . Smokeless tobacco: None  . Alcohol Use: No   OB History    No data available     Review of Systems  Constitutional: Negative.  Negative for fever and chills.  Respiratory: Negative for chest tightness and shortness of breath.    Cardiovascular: Negative for chest pain.  Gastrointestinal: Positive for nausea, abdominal pain and diarrhea. Negative for vomiting, constipation, blood in stool, abdominal distention, anal bleeding and rectal pain.  Genitourinary: Positive for vaginal discharge and vaginal pain (irritation and burning). Negative for dysuria, urgency, frequency, hematuria, flank pain, decreased urine volume, vaginal bleeding and pelvic pain.  Musculoskeletal: Negative for back pain.  Skin: Negative.   Neurological: Negative for weakness.    Allergies  Review of patient's allergies indicates no known allergies.  Home Medications   Prior to Admission medications   Medication Sig Start Date End Date Taking? Authorizing Provider  AMBULATORY NON FORMULARY MEDICATION Medication Name: Glucomete with strips. Dx. 250.00 05/28/11   Agapito Games, MD  fexofenadine (ALLEGRA) 180 MG tablet Take 1 tablet (180 mg total) by mouth daily. 01/04/11 01/04/12  Agapito Games, MD  fluconazole (DIFLUCAN) 200 MG tablet Take 1 tablet (200 mg total) by mouth daily. Take 1 pill one time per episode 04/20/15   Junius Finner, PA-C  glimepiride (AMARYL) 2 MG tablet Take 1 tablet (2 mg total) by mouth daily before breakfast. 04/03/12   Agapito Games, MD  metFORMIN (GLUCOPHAGE) 1000 MG tablet Take 1 tablet (1,000 mg total) by mouth daily. 04/03/12   Agapito Games, MD  metroNIDAZOLE (FLAGYL) 500 MG tablet Take 1 tablet (500 mg total) by mouth 3 (three) times daily. One po bid x 7 days 04/20/15   Junius Finner, PA-C  sulfamethoxazole-trimethoprim (BACTRIM DS,SEPTRA DS) 800-160 MG per tablet Take  1 tablet by mouth 2 (two) times daily. 04/20/15 04/27/15  Junius Finner, PA-C   BP 117/77 mmHg  Pulse 89  Temp(Src) 98.2 F (36.8 C) (Oral)  Resp 16  Ht 5\' 4"  (1.626 m)  Wt 193 lb (87.544 kg)  BMI 33.11 kg/m2  SpO2 98%  LMP 04/06/2015 Physical Exam  Constitutional: She appears well-developed and well-nourished. No  distress.  HENT:  Head: Normocephalic and atraumatic.  Eyes: Conjunctivae are normal. No scleral icterus.  Neck: Normal range of motion. Neck supple.  Cardiovascular: Normal rate, regular rhythm and normal heart sounds.   Pulmonary/Chest: Effort normal and breath sounds normal. No respiratory distress. She has no wheezes. She has no rales. She exhibits no tenderness.  Abdominal: Soft. Bowel sounds are normal. She exhibits no distension and no mass. There is tenderness. There is no rebound and no guarding.  Obese abdomen, soft, diffuse tenderness, worse on Left side. No rebound, guarding or masses palpated. No CVAT  Genitourinary:  Deferred. Pt declined  Musculoskeletal: Normal range of motion.  Neurological: She is alert.  Skin: Skin is warm and dry. She is not diaphoretic.  Nursing note and vitals reviewed.   ED Course  Procedures (including critical care time) Labs Review Labs Reviewed  POCT FASTING CBG [facility] MANUAL ENTRY - Abnormal; Notable for the following:    POCT Glucose ([facility]) 297 (*)    All other components within normal limits  WET PREP, GENITAL  COMPLETE METABOLIC PANEL WITH GFR  POCT URINALYSIS DIP (MANUAL ENTRY)  POCT CBC W AUTO DIFF ([facility])    Imaging Review No results found.   MDM   1. Vaginal itching   2. Generalized abdominal pain   3. Type 2 diabetes mellitus without complication    Pt c/o vaginal itching, discharge that is c/w prior episodes of vaginal yeast infections. Pt decline pelvic exam as she is not concerned for STDs and denies hx of ovarian cysts or fibroids. CBG-297, could be cause of her yeast infection.  Doubt DKA. Rx: fluconazole x1 dose (3 extra tabs prescribed for recurrent episodes)  UA: no evidence of UTI  Pt also c/o diffuse abdominal pain and mild intermittent diarrhea. Pt is tender to Left side but no evidence of surgical abdomen at this time. Will tx clinically for diverticulitis. Rx: metronidazole and bactrim (cipro  interacts with pt's glimepiride)  Pt advised to f/u with PCP in 1 week if not improving, sooner if worsening.   Discussed symptoms that warrant emergent care in the ED. Patient verbalized understanding and agreement with treatment plan.     Junius Finner, PA-C 04/20/15 [phone]

## 2015-04-20 NOTE — ED Notes (Signed)
Pt c/o clear/white vaginal d/c and itching x 1 wk. She used OTC Monistat yesterday with no relief. Denies sexual activity since 2008.

## 2015-04-20 NOTE — Discharge Instructions (Signed)
Abdominal Pain, Women °Abdominal (stomach, pelvic, or belly) pain can be caused by many things. It is important to tell your doctor: °· The location of the pain. °· Does it come and go or is it present all the time? °· Are there things that start the pain (eating certain foods, exercise)? °· Are there other symptoms associated with the pain (fever, nausea, vomiting, diarrhea)? °All of this is helpful to know when trying to find the cause of the pain. °CAUSES  °· Stomach: virus or bacteria infection, or ulcer. °· Intestine: appendicitis (inflamed appendix), regional ileitis (Crohn's disease), ulcerative colitis (inflamed colon), irritable bowel syndrome, diverticulitis (inflamed diverticulum of the colon), or cancer of the stomach or intestine. °· Gallbladder disease or stones in the gallbladder. °· Kidney disease, kidney stones, or infection. °· Pancreas infection or cancer. °· Fibromyalgia (pain disorder). °· Diseases of the female organs: °¨ Uterus: fibroid (non-cancerous) tumors or infection. °¨ Fallopian tubes: infection or tubal pregnancy. °¨ Ovary: cysts or tumors. °¨ Pelvic adhesions (scar tissue). °¨ Endometriosis (uterus lining tissue growing in the pelvis and on the pelvic organs). °¨ Pelvic congestion syndrome (female organs filling up with blood just before the menstrual period). °¨ Pain with the menstrual period. °¨ Pain with ovulation (producing an egg). °¨ Pain with an IUD (intrauterine device, birth control) in the uterus. °¨ Cancer of the female organs. °· Functional pain (pain not caused by a disease, may improve without treatment). °· Psychological pain. °· Depression. °DIAGNOSIS  °Your doctor will decide the seriousness of your pain by doing an examination. °· Blood tests. °· X-rays. °· Ultrasound. °· CT scan (computed tomography, special type of X-ray). °· MRI (magnetic resonance imaging). °· Cultures, for infection. °· Barium enema (dye inserted in the large intestine, to better view it with  X-rays). °· Colonoscopy (looking in intestine with a lighted tube). °· Laparoscopy (minor surgery, looking in abdomen with a lighted tube). °· Major abdominal exploratory surgery (looking in abdomen with a large incision). °TREATMENT  °The treatment will depend on the cause of the pain.  °· Many cases can be observed and treated at home. °· Over-the-counter medicines recommended by your caregiver. °· Prescription medicine. °· Antibiotics, for infection. °· Birth control pills, for painful periods or for ovulation pain. °· Hormone treatment, for endometriosis. °· Nerve blocking injections. °· Physical therapy. °· Antidepressants. °· Counseling with a psychologist or psychiatrist. °· Minor or major surgery. °HOME CARE INSTRUCTIONS  °· Do not take laxatives, unless directed by your caregiver. °· Take over-the-counter pain medicine only if ordered by your caregiver. Do not take aspirin because it can cause an upset stomach or bleeding. °· Try a clear liquid diet (broth or water) as ordered by your caregiver. Slowly move to a bland diet, as tolerated, if the pain is related to the stomach or intestine. °· Have a thermometer and take your temperature several times a day, and record it. °· Bed rest and sleep, if it helps the pain. °· Avoid sexual intercourse, if it causes pain. °· Avoid stressful situations. °· Keep your follow-up appointments and tests, as your caregiver orders. °· If the pain does not go away with medicine or surgery, you may try: °¨ Acupuncture. °¨ Relaxation exercises (yoga, meditation). °¨ Group therapy. °¨ Counseling. °SEEK MEDICAL CARE IF:  °· You notice certain foods cause stomach pain. °· Your home care treatment is not helping your pain. °· You need stronger pain medicine. °· You want your IUD removed. °· You feel faint or   lightheaded. °· You develop nausea and vomiting. °· You develop a rash. °· You are having side effects or an allergy to your medicine. °SEEK IMMEDIATE MEDICAL CARE IF:  °· Your  pain does not go away or gets worse. °· You have a fever. °· Your pain is felt only in portions of the abdomen. The right side could possibly be appendicitis. The left lower portion of the abdomen could be colitis or diverticulitis. °· You are passing blood in your stools (bright red or black tarry stools, with or without vomiting). °· You have blood in your urine. °· You develop chills, with or without a fever. °· You pass out. °MAKE SURE YOU:  °· Understand these instructions. °· Will watch your condition. °· Will get help right away if you are not doing well or get worse. °Document Released: 06/20/2007 Document Revised: 01/07/2014 Document Reviewed: 07/10/2009 °[employer]® Patient Information ©2015 [employer]. This information is not intended to replace advice given to you by your health care provider. Make sure you discuss any questions you have with your health care provider. ° °

## 2015-04-22 ENCOUNTER — Telehealth: Payer: Self-pay | Admitting: *Deleted

## 2015-04-22 LAB — WET PREP BY MOLECULAR PROBE
Candida species: POSITIVE — AB
Gardnerella vaginalis: POSITIVE — AB
Trichomonas vaginosis: NEGATIVE

## 2015-04-23 ENCOUNTER — Telehealth: Payer: Self-pay

## 2015-08-09 ENCOUNTER — Encounter: Payer: Self-pay | Admitting: Emergency Medicine

## 2015-08-09 ENCOUNTER — Emergency Department
Admission: EM | Admit: 2015-08-09 | Discharge: 2015-08-09 | Disposition: A | Discharge status: Home / Self Care | Payer: Self-pay | Source: Home / Self Care | Attending: Family Medicine | Admitting: Family Medicine

## 2015-08-09 DIAGNOSIS — R51 Headache: Secondary | ICD-10-CM

## 2015-08-09 DIAGNOSIS — R739 Hyperglycemia, unspecified: Secondary | ICD-10-CM

## 2015-08-09 DIAGNOSIS — R42 Dizziness and giddiness: Secondary | ICD-10-CM

## 2015-08-09 DIAGNOSIS — R519 Headache, unspecified: Secondary | ICD-10-CM

## 2015-08-09 DIAGNOSIS — R112 Nausea with vomiting, unspecified: Secondary | ICD-10-CM

## 2015-08-09 LAB — COMPREHENSIVE METABOLIC PANEL
ALT: 25 U/L (ref 6–29)
AST: 14 U/L (ref 10–30)
Albumin: 4.2 g/dL (ref 3.6–5.1)
Alkaline Phosphatase: 73 U/L (ref 33–115)
BUN: 8 mg/dL (ref 7–25)
CHLORIDE: 101 mmol/L (ref 98–110)
CO2: 26 mmol/L (ref 20–31)
Calcium: 9.1 mg/dL (ref 8.6–10.2)
Creat: 0.52 mg/dL (ref 0.50–1.10)
GLUCOSE: 260 mg/dL — AB (ref 65–99)
POTASSIUM: 4.7 mmol/L (ref 3.5–5.3)
SODIUM: 133 mmol/L — AB (ref 135–146)
Total Bilirubin: 0.5 mg/dL (ref 0.2–1.2)
Total Protein: 6.8 g/dL (ref 6.1–8.1)

## 2015-08-09 LAB — POCT URINALYSIS DIP (MANUAL ENTRY)
Bilirubin, UA: NEGATIVE
Blood, UA: NEGATIVE
Glucose, UA: 250 — AB
Leukocytes, UA: NEGATIVE
Nitrite, UA: NEGATIVE
Spec Grav, UA: 1.025 (ref 1.005–1.03)
Urobilinogen, UA: 0.2 (ref 0–1)
pH, UA: 5.5 (ref 5–8)

## 2015-08-09 LAB — POCT CBC W AUTO DIFF (K'VILLE URGENT CARE)

## 2015-08-09 LAB — POCT FASTING CBG KUC MANUAL ENTRY: POCT Glucose (KUC): 242 mg/dL — AB (ref 70–99)

## 2015-08-09 MED ORDER — ONDANSETRON 4 MG PO TBDP
4.0000 mg | ORAL_TABLET | Freq: Once | ORAL | Status: AC
  Administered 2015-08-09: 4 mg via ORAL

## 2015-08-09 MED ORDER — MECLIZINE HCL 25 MG PO TABS: 25.0000 mg | ORAL_TABLET | Freq: Three times a day (TID) | ORAL | Status: AC | PRN

## 2015-08-09 MED ORDER — ONDANSETRON HCL 4 MG PO TABS
4.0000 mg | ORAL_TABLET | Freq: Once | ORAL | Status: AC
  Administered 2015-08-09: 4 mg via ORAL

## 2015-08-09 MED ORDER — SODIUM CHLORIDE 0.9 % IV BOLUS (SEPSIS)
1000.0000 mL | Freq: Once | INTRAVENOUS | Status: AC
  Administered 2015-08-09: 1000 mL via INTRAVENOUS

## 2015-08-09 MED ORDER — PROMETHAZINE HCL 25 MG PO TABS
25.0000 mg | ORAL_TABLET | Freq: Four times a day (QID) | ORAL | Status: AC | PRN
Start: 2015-08-09 — End: ?

## 2015-08-09 NOTE — ED Notes (Signed)
Pt c/o N+V that started suddenly yesterday. Denies diarrhea. C/o intermittent mild stomach pain with emesis.

## 2015-08-09 NOTE — ED Provider Notes (Signed)
CSN: [id number]     Arrival date & time 08/09/15  1154 History   First MD Initiated Contact with Patient 08/09/15 1231     Chief Complaint  Patient presents with  . Nausea  . Emesis   (Consider location/radiation/quality/duration/timing/severity/associated sxs/prior Treatment) HPI  Pt is a 29yo female with hx of type 2 DM, [facility] with [facility]/o sudden onset nausea and vomiting that started suddenly yesterday evening. Pt now c/o dizziness and generalized weakness.  Symptoms started yesterday with a generalized headache, which pt believed to be due to not eating all day. Headache gradually worsened as well as nausea and vomiting.  She now has dizziness with certain head movements. Dizziness described as head spinning.  Pt states her CBG was 314.  She takes metformin and glimepiride.  Denies recent illness of cough congestion fever or chills. She reports only mild diffuse abdominal soreness from vomiting. Pt unsure how many times she vomited in 24 hours but believes 10-15 times. No blood or bile in emesis. Denies diarrhea. No sick contacts or recent travel.  Abdominal surgical hx significant for cholecystectomy.  Pt states no chance of pregnancy as she has been celibate for several years. Hx of PCOS.   Past Medical History  Diagnosis Date  . Asthma   . Polycystic ovarian syndrome   . Diabetes mellitus without complication [facility](HCC)    Past Surgical History  Procedure Laterality Date  . Cholecystectomy     Family History  Problem Relation Age of Onset  . Leukemia Mother   . Cancer Mother     leukemia   Social History  Substance Use Topics  . Smoking status: Never Smoker   . Smokeless tobacco: None  . Alcohol Use: No   OB History    No data available     Review of Systems  Constitutional: Negative for fever and chills.  HENT: Negative for congestion and sore throat.   Eyes: Negative for photophobia, pain and visual disturbance.  Respiratory: Negative for cough and shortness of  breath.   Gastrointestinal: Positive for nausea, vomiting and abdominal pain. Negative for diarrhea and constipation.  Neurological: Positive for dizziness, weakness ( generalized) and headaches. Negative for syncope, light-headedness and numbness.    Allergies  Review of patient's allergies indicates no known allergies.  Home Medications   Prior to Admission medications   Medication Sig Start Date End Date Taking? Authorizing Provider  AMBULATORY NON FORMULARY MEDICATION Medication Name: Glucomete with strips. Dx. 250.00 05/28/11   Agapito Gamesatherine D Metheney, MD  fexofenadine (ALLEGRA) 180 MG tablet Take 1 tablet (180 mg total) by mouth daily. 01/04/11 01/04/12  Agapito Gamesatherine D Metheney, MD  fluconazole (DIFLUCAN) 200 MG tablet Take 1 tablet (200 mg total) by mouth daily. Take 1 pill one time per episode 04/20/15   Junius FinnerErin O'Malley, PA-C  glimepiride (AMARYL) 2 MG tablet Take 1 tablet (2 mg total) by mouth daily before breakfast. 04/03/12   Agapito Gamesatherine D Metheney, MD  meclizine (ANTIVERT) 25 MG tablet Take 1 tablet (25 mg total) by mouth 3 (three) times daily as needed for dizziness. 08/09/15   Junius FinnerErin O'Malley, PA-C  metFORMIN (GLUCOPHAGE) 1000 MG tablet Take 1 tablet (1,000 mg total) by mouth daily. 04/03/12   Agapito Gamesatherine D Metheney, MD  metroNIDAZOLE (FLAGYL) 500 MG tablet Take 1 tablet (500 mg total) by mouth 3 (three) times daily. One po bid x 7 days 04/20/15   Junius FinnerErin O'Malley, PA-C  promethazine (PHENERGAN) 25 MG tablet Take 1 tablet (25 mg total) by mouth  every 6 (six) hours as needed for nausea or vomiting. 08/09/15   Junius Finner, PA-C   Meds Ordered and Administered this Visit   Medications  ondansetron [facility]) tablet 4 mg (4 mg Oral Given 08/09/15 1247)  sodium chloride 0.9 % bolus 1,000 mL (1,000 mLs Intravenous Given 08/09/15 1333)  ondansetron (ZOFRAN-ODT) disintegrating tablet 4 mg (4 mg Oral Given 08/09/15 1247)    BP 112/72 mmHg  Pulse 90  Temp(Src) 97.6 F (36.4 C) (Oral)  SpO2 99% No data  found.   Physical Exam  Constitutional: She is oriented to person, place, and time. She appears well-developed and well-nourished. No distress.  HENT:  Head: Normocephalic and atraumatic.  Right Ear: Hearing, tympanic membrane, external ear and ear canal normal.  Left Ear: Hearing, tympanic membrane, external ear and ear canal normal.  Nose: Nose normal.  Mouth/Throat: Uvula is midline, oropharynx is clear and moist and mucous membranes are normal.  Eyes: Conjunctivae are normal. Pupils are equal, round, and reactive to light. No scleral icterus. Right eye exhibits nystagmus. Left eye exhibits nystagmus.  Mild horizontal nystagmus in both eyes. EOM exam exacerbates pt's symptoms  Neck: Normal range of motion. Neck supple.  Cardiovascular: Normal rate, regular rhythm and normal heart sounds.   Pulmonary/Chest: Effort normal and breath sounds normal. No respiratory distress. She has no wheezes. She has no rales. She exhibits no tenderness.  Abdominal: Soft. Bowel sounds are normal. She exhibits no distension and no mass. There is no tenderness. There is no rebound, no guarding and no CVA tenderness.  Obese abdomen, soft, non-distended, non-tender. No rebound, guarding or masses. No CVAT  Musculoskeletal: Normal range of motion.  Neurological: She is alert and oriented to person, place, and time. She has normal strength. No cranial nerve deficit or sensory deficit. She displays a negative Romberg sign. Coordination and gait normal. GCS eye subscore is 4. GCS verbal subscore is 5. GCS motor subscore is 6.  Alert to person, place, and time. Speech is clear. Able to follow 2 step commands. Normal finger to nose coordination.   Skin: Skin is warm and dry. She is not diaphoretic.  Nursing note and vitals reviewed.   ED Course  Procedures (including critical care time)  Labs Review Labs Reviewed  POCT URINALYSIS DIP (MANUAL ENTRY) - Abnormal; Notable for the following:    Glucose, UA =250 (*)     Ketones, POC UA small (15) (*)    Protein Ur, POC trace (*)    All other components within normal limits  POCT FASTING CBG [facility] MANUAL ENTRY - Abnormal; Notable for the following:    POCT Glucose ([facility]) 242 (*)    All other components within normal limits  COMPREHENSIVE METABOLIC PANEL  COMPLETE METABOLIC PANEL WITH GFR  POCT CBC W AUTO DIFF ([facility])    Imaging Review No results found.    MDM   1. Non-intractable vomiting with nausea, unspecified vomiting type   2. Dizziness   3. Generalized headache   4. Hyperglycemia    Pt c/o dizziness, headache, nausea and vomiting. Benign abdominal exam. Hx of cholecystectomy. Doubt SBO or appendicitis.  Exam c/w vertigo.  Doubt SAH or meningitis. Exam c/w vertigo, possible underlying viral illness   BP low at 97/66 in triage, improved to 112/72 after 1L IV fluids Tx in UC: Zofran, IV fluids.  Pt able to keep down several ounces of ginger ale.  CBG: 242... Pt states it was 314 last night CBC: unremarkable UA: few ketones  Pt is Type 2 diabetic, no hx of DKA. Ketones likely due to mild dehydration from vomiting. Doubt DKA at this time. Encouraged fluids and rest Rx: Phenergan and Ativert  Multiple resource guides provided to pt for help with diabetes supplies and ongoing healthcare needs    Discussed symptoms that warrant emergent care in the ED. Patient and mother verbalized understanding and agreement with treatment plan.  Pt stable for discharge home, accompanied by her mother.     Junius Finner, PA-C 08/09/15 [phone]

## 2015-08-09 NOTE — Discharge Instructions (Signed)
[place] Resource Guide  The resources below are primarily for [place], if you live in [place], please call the [facility] Health Department for assistance and additional resources for your ongoing healthcare needs including diabetic programs that may provide free or reduced cost of blood sugar testing kits.   [facility] Department of health:  Address: [address], [place], [place] [address] Phone: [phone]  Or [phone]  1) Find a Doctor and Pay Out of Pocket Although you won't have to find out who is covered by your insurance plan, it is a good idea to ask around and get recommendations. You will then need to call the office and see if the doctor you have chosen will accept you as a new patient and what types of options they offer for patients who are self-pay. Some doctors offer discounts or will set up payment plans for their patients who do not have insurance, but you will need to ask so you aren't surprised when you get to your appointment.  2) Contact Your Local Health Department Not all health departments have doctors that can see patients for sick visits, but many do, so it is worth a call to see if yours does. If you don't know where your local health department is, you can check in your phone book. The CDC also has a tool to help you locate your state's health department, and many state websites also have listings of all of their local health departments.  3) Find a Walk-in Clinic If your illness is not likely to be very severe or complicated, you may want to try a walk in clinic. These are popping up all over the country in pharmacies, drugstores, and shopping centers. They're usually staffed by nurse practitioners or physician assistants that have been trained to treat common illnesses and complaints. They're usually fairly quick and inexpensive. However, if you have serious medical issues or chronic medical problems, these are probably not your  best option.  No Primary Care Doctor: - Call Health Connect at  [phone] - they can help you locate a primary care doctor that  accepts your insurance, provides certain services, etc. - Physician Referral Service- [phone]  Chronic Pain Problems: Organization         Address  Phone   Notes  [facility]  [phone] Patients need to be referred by their primary care doctor.   Medication Assistance: Organization         Address  Phone   Notes  [facility] Medication [facility]istance Program [address] [place], [place] [address] [phone]) [phone] --Must be a resident of [facility] -- Must have NO insurance coverage whatsoever (no Medicaid/ Medicare, etc.) -- The pt. MUST have a primary care doctor that directs their care regularly and follows them in the community   MedAssist  [phone]   [employer]  [phone]    Agencies that provide inexpensive medical care: Organization         Address  Phone   Notes  [facility]  [phone](336) [phone]   [facility]    [phone]   [facility] [address] [place], [place] [address] [phone]   [facility] [address], [place] [phone](336) [phone]   Planned Parenthood    [phone](336) [phone]   [facility]    [phone]   [facility] and [facility]  [address], [place] Phone:  [phone], Fax:  [phone] Hours of Operation:  9 am - 6 pm, M-F.  Also accepts Medicaid/Medicare and self-pay.  [facility] for [place], [address], [place] Phone: [phone], Fax: [phone]. Hours of Operation:  8:30 am - 5:30 pm, M-F.  Also accepts Medicaid and self-pay.  [facility] [place] [address], [place] Phone: [phone]   [place], [place], [place] [phone], Ext. [phone] Mondays & Thursdays: 7-9 AM.  First 15  patients are seen on a first come, first serve basis.    [place] Providers:  Organization         Address  Phone   Notes  [facility] [address], [place] [phone] Also accepts self-pay patients.  [facility] [address], [place]  [phone]   [place], [address], [place] [phone]   [facility] [address] [place] [phone]   Lucianne Lei [address], [place]   [phone] Only accepts [place] Access [place] patients after they have their name applied to their card.   Self-Pay (no insurance) in [facility]:  Organization         Address  Phone   Notes  Sickle Cell Patients, [facility] [place] [phone]   [facility] [place] [phone]   [facility] [place]  [place], [place], [place] [phone]   [facility]/Dr. Osei-Bonsu  [address], [place] [address], [place] [phone] Phone number for both [place] and [place] locations is the same.  Urgent Medical and [facility] [address] [place] [phone]   [facility] [address], [place] or [address] [phone]   [facility] [address] [place] [phone], phone; [phone], fax Sees patients 1st and 3rd Saturday of every month.  Must not qualify for public or private insurance (i.e. Medicaid, Medicare, [place] Health Choice, Veterans' Benefits)  Household income should be no more than 200% of the poverty level The clinic cannot treat you if you are pregnant or think you are pregnant  Sexually transmitted diseases are not treated at the clinic.    Dental  Care: Organization         Address  Phone  Notes  [facility] Department of [facility] [place] [phone] Accepts children up to age 61 who are enrolled in [place] or [employer] [place]; pregnant women with a Medicaid card; and children who have applied for Medicaid or [place] Health Choice, but were declined, whose parents can pay a reduced fee at time of service.  [facility] Department of [facility]  [address], [place] [phone] Accepts children up to age 47 who are enrolled in [place] or [place]; pregnant women with a Medicaid card; and children who have applied for Medicaid or [place] Health Choice, but were declined, whose parents can pay a reduced fee at time of service.  [facility] Adult Dental Access PROGRAM  [place], [place] [phone]  Patients are seen by appointment only. Walk-ins are not accepted. [place] will see patients 1 years of age and older. Monday - Tuesday (8am-5pm) Most Wednesdays (8:30-5pm) $30 per visit, cash only  [facility] Adult Dental Access PROGRAM  [address], [facility] [phone] Patients are seen by appointment only. Walk-ins are not accepted. [place] will see patients 28 years of age and older. One Wednesday Evening (Monthly: Volunteer Based).  $30 per visit, cash only  [place]  [phone] for adults; Children under age 81, call Graduate Pediatric Dentistry at [phone]. Children aged 82-14, please call [phone] to request a pediatric application.  Dental services are provided in all areas of dental care including fillings, crowns and bridges, complete and partial dentures, implants, gum treatment, root canals, and extractions. Preventive care is also provided. Treatment is provided to both adults and children. Patients are selected via a lottery and there is often a waiting list.   [facility] [address] [place]  [phone] [email or web address]   [facility] [address] [place], [place] [phone], Ext. [phone] Second and Fourth Thursday of each month, opens at 6:30 AM; Clinic ends at 9 AM.  Patients are seen on a first-come first-served basis, and a limited number are seen during each clinic.   [facility]  [address] Hillard Danker [place], [place] [phone]   Eligibility Requirements You must have lived in [place], [place], or [place] counties for at least the last three months.   You cannot be eligible for state or federal sponsored [employer], including [employer], [place], or [employer].   You generally cannot be eligible for healthcare insurance through your employer.    How to apply: Eligibility screenings are held every Tuesday and Wednesday afternoon from 1:00 pm until 4:00 pm. You do not need an appointment for the interview!  [facility] [address] [place], [place]   [place]  [place] Department  [place]  [phone]    Behavioral Health Resources in the Community: Intensive Outpatient Programs Organization         Address  Phone  Notes  [place]. [address], [place], [place] [phone]   [facility] Outpatient [address] [place], [place]   ADS: Alcohol & Drug Svcs [address], [place], [place]   [place] [address],  [place], [place] or [phone]   Substance Abuse Resources Organization         Address  Phone  Notes  Alcohol and Drug Services  [phone]   [place]  [phone]   The [place]   Chinita Pester  [phone]   Residential & Outpatient Substance Abuse Program  [phone]    Psychological Services Organization         Address  Phone  Notes  [facility] [place]  [place]  [phone]   [place] [address], [place] or [phone]    Mobile Crisis Teams Organization         Address  Phone  Notes  [facility]rapeutic Alternatives, Mobile Crisis Care Unit  [phone]   Assertive Psychotherapeutic Services  [address] [employer], [place]   [facility] [address] [place]  [place] [phone]    Self-Help/Support Groups Organization         Address  Phone             Notes  Mental Health Assoc. of [place] - variety of support groups  [place] Call for more information  Narcotics Anonymous (NA), Caring Services [address], [employer] [place]  2 meetings at this location   Special educational needs teacher         Address  Phone  Notes  ASAP Residential Treatment [place],    [place]  [phone]   [facility]  [address] [place] [phone], [place], [place]   [place], [place] [phone] Admissions: 8am-3pm M-F  Incentives Substance [place] [address],    [place], [place] [phone]   [facility] [address] [place], [place], [place]   [facility] [address]  [place], [place]   [facility]ight Programs - Intensive Outpatient [address], [place], [place]   [facility] [place].,  [place], [place] [phone] or [phone]   [facility] Services (RTS) [address], [place], [place] Accepts Medicaid  [facility] [place] [address]  [place] [phone] Substance Abuse/Addiction Treatment   [facility] Organization         Address  Phone  Notes  [facility]  [phone]   Domenic Schwab, PhD [address] Arlis [place], [place]   [phone] or [phone]   [place], [place] [phone]   [facility] [address], [place], [place] [phone] Insurance/Medicaid/sponsorship through [facility] and Families [address] [place]                                    [place], [place] [phone] [place] [address], [place] [phone]    Dr. Adele Schilder  [phone]   [facility] Dept. 1) [address],  2) [place] 3)  [place] [address], [place] [phone]  [phone]   [place] [phone] or [phone] (After Hours)

## 2015-08-10 ENCOUNTER — Telehealth: Payer: Self-pay | Admitting: *Deleted
